# Patient Record
Sex: Male | Born: 1970 | Race: Black or African American | Hispanic: No | Marital: Married | Smoking: Never smoker
Health system: Southern US, Community
[De-identification: ages and names within clinical notes are randomized; demographics above are authoritative.]

## PROBLEM LIST (undated history)

## (undated) DIAGNOSIS — J45909 Unspecified asthma, uncomplicated: Secondary | ICD-10-CM

## (undated) DIAGNOSIS — I10 Essential (primary) hypertension: Secondary | ICD-10-CM

## (undated) DIAGNOSIS — E119 Type 2 diabetes mellitus without complications: Secondary | ICD-10-CM

## (undated) HISTORY — PX: OTHER SURGICAL HISTORY: SHX169

## (undated) HISTORY — PX: ABDOMINAL SURGERY: SHX537

## (undated) HISTORY — PX: SINUS SURGERY: SHX187

## (undated) HISTORY — PX: APPENDECTOMY (OPEN): SHX54

---

## 2005-07-25 ENCOUNTER — Ambulatory Visit (INDEPENDENT_AMBULATORY_CARE_PROVIDER_SITE_OTHER)
Admit: 2005-07-25 | Disposition: A | Discharge status: Home / Self Care | Payer: Self-pay | Source: Ambulatory Visit | Admitting: Emergency Medicine

## 2016-08-09 ENCOUNTER — Ambulatory Visit
Admission: RE | Admit: 2016-08-09 | Discharge: 2016-08-09 | Disposition: A | Discharge status: Home / Self Care | Payer: Commercial Managed Care - POS | Source: Ambulatory Visit | Attending: Critical Care Medicine | Admitting: Critical Care Medicine

## 2016-08-09 DIAGNOSIS — R0602 Shortness of breath: Secondary | ICD-10-CM | POA: Insufficient documentation

## 2016-08-09 MED ORDER — SODIUM CHLORIDE 0.9 % IV SOLN
0.5000 mg | Freq: Once | INTRAVENOUS | Status: AC
Start: 2016-08-09 — End: 2016-08-09
  Administered 2016-08-09: 0.5 mg via RESPIRATORY_TRACT
  Filled 2016-08-09: qty 0.5

## 2016-08-09 MED ORDER — SODIUM CHLORIDE 0.9 % IV SOLN
20.0000 mg | Freq: Once | INTRAVENOUS | Status: DC
Start: 2016-08-09 — End: 2016-08-10
  Filled 2016-08-09: qty 20

## 2016-08-09 MED ORDER — SODIUM CHLORIDE 0.9 % IV SOLN
0.0500 mg | Freq: Once | INTRAVENOUS | Status: AC
Start: 2016-08-09 — End: 2016-08-09
  Administered 2016-08-09: 0.05 mg via RESPIRATORY_TRACT
  Filled 2016-08-09: qty 0.05

## 2016-08-09 MED ORDER — SODIUM CHLORIDE 0.9 % IV SOLN
50.0000 mg | Freq: Once | INTRAVENOUS | Status: DC
Start: 2016-08-09 — End: 2016-08-10
  Filled 2016-08-09: qty 50

## 2016-08-09 MED ORDER — ALBUTEROL SULFATE (2.5 MG/3ML) 0.083% IN NEBU
2.5000 mg | INHALATION_SOLUTION | Freq: Once | RESPIRATORY_TRACT | Status: AC
Start: 2016-08-09 — End: 2016-08-09
  Administered 2016-08-09: 2.5 mg via RESPIRATORY_TRACT
  Filled 2016-08-09: qty 3

## 2016-08-09 MED ORDER — SODIUM CHLORIDE 0.9 % IV SOLN
5.0000 mg | Freq: Once | INTRAVENOUS | Status: AC
Start: 2016-08-09 — End: 2016-08-09
  Administered 2016-08-09: 5 mg via RESPIRATORY_TRACT
  Filled 2016-08-09: qty 5

## 2016-08-17 NOTE — Procedures (Signed)
Service Date: 08/09/2016     Patient Type: O     PHYSICIAN/PROVIDER: Darrold Junker MD     REFERRING PHYSICIAN:      PROCEDURE:  Methacholine challenge test.     DATA:  The patient underwent a methacholine challenge test per the usual protocol.   After only level 3 of methacholine administration, there was a dramatic  and abrupt 61% decline in the FEV1.  There was improvement in the loss of  FEV1 after albuterol administration, although his lung capacity did not  return to baseline.     IMPRESSION:  Dramatically positive methacholine challenge test.           D:  08/17/2016 06:48 AM by Dr. Darrold Junker, MD [id number])  T:  08/17/2016 11:18 AM by NTS      Lorin Glass: [id number]) (Doc ID: [id number])

## 2016-10-09 ENCOUNTER — Emergency Department (HOSPITAL_BASED_OUTPATIENT_CLINIC_OR_DEPARTMENT_OTHER)
Admit: 2016-10-09 | Discharge: 2016-10-09 | Disposition: A | Discharge status: Home / Self Care | Payer: Self-pay | Attending: Emergency Medicine | Admitting: Emergency Medicine

## 2016-10-09 ENCOUNTER — Emergency Department (HOSPITAL_BASED_OUTPATIENT_CLINIC_OR_DEPARTMENT_OTHER)
Admission: EM | Admit: 2016-10-09 | Discharge: 2016-10-09 | Disposition: A | Discharge status: Home / Self Care | Payer: Self-pay | Attending: Emergency Medicine | Admitting: Emergency Medicine

## 2016-10-09 ENCOUNTER — Encounter (HOSPITAL_BASED_OUTPATIENT_CLINIC_OR_DEPARTMENT_OTHER): Payer: Self-pay | Admitting: *Deleted

## 2016-10-09 DIAGNOSIS — J45901 Unspecified asthma with (acute) exacerbation: Secondary | ICD-10-CM | POA: Insufficient documentation

## 2016-10-09 HISTORY — DX: Unspecified asthma, uncomplicated: J45.909

## 2016-10-09 MED ORDER — PREDNISONE 20 MG PO TABS: 40.0000 mg | ORAL_TABLET | Freq: Every day | ORAL | 0 refills | Status: AC

## 2016-10-09 MED ORDER — DEXAMETHASONE SODIUM PHOSPHATE 10 MG/ML IJ SOLN
10.0000 mg | Freq: Once | INTRAMUSCULAR | Status: AC
  Administered 2016-10-09: 10 mg via INTRAMUSCULAR
  Filled 2016-10-09: qty 1

## 2016-10-09 MED ORDER — IPRATROPIUM-ALBUTEROL 0.5-2.5 (3) MG/3ML IN SOLN: 3.0000 mL | Freq: Four times a day (QID) | RESPIRATORY_TRACT | Status: DC

## 2016-10-09 MED ORDER — IPRATROPIUM-ALBUTEROL 0.5-2.5 (3) MG/3ML IN SOLN
3.0000 mL | Freq: Once | RESPIRATORY_TRACT | Status: AC
  Administered 2016-10-09: 3 mL via RESPIRATORY_TRACT
  Filled 2016-10-09: qty 3

## 2016-10-09 MED ORDER — ALBUTEROL (5 MG/ML) CONTINUOUS INHALATION SOLN
15.0000 mg/h | INHALATION_SOLUTION | Freq: Once | RESPIRATORY_TRACT | Status: AC
  Administered 2016-10-09: 15 mg/h via RESPIRATORY_TRACT
  Filled 2016-10-09: qty 20

## 2016-10-09 MED ORDER — ALBUTEROL SULFATE (2.5 MG/3ML) 0.083% IN NEBU
2.5000 mg | INHALATION_SOLUTION | Freq: Once | RESPIRATORY_TRACT | Status: AC
  Administered 2016-10-09: 2.5 mg via RESPIRATORY_TRACT
  Filled 2016-10-09: qty 3

## 2016-10-09 NOTE — ED Provider Notes (Signed)
[facility] Provider Note   CSN: [id number] Arrival date & time: 10/09/16  0054     History   Chief Complaint Chief Complaint  Patient presents with  . Shortness of Breath    HPI Kyle Wong is a 45 y.o. male.  HPI  This is a 45 year old male with history of asthma who presents with shortness of breath. Patient reports 2 day history of worsening upper respiratory symptoms including cough and shortness of breath. Denies fevers. He is currently traveling and does not have his Advair. Reports that he is using his rescue inhaler at home with minimal relief. No history of admission with asthma. No intubations. Patient denies chest pain.  Past Medical History:  Diagnosis Date  . Asthma     There are no active problems to display for this patient.   Past Surgical History:  Procedure Laterality Date  . ABDOMINAL SURGERY    . apendectomy         Home Medications    Prior to Admission medications   Medication Sig Start Date End Date Taking? Authorizing Provider  albuterol (PROVENTIL HFA;VENTOLIN HFA) 108 (90 Base) MCG/ACT inhaler Inhale 2 puffs into the lungs every 6 (six) hours as needed for wheezing or shortness of breath.   Yes Historical Provider, MD  fluticasone-salmeterol (ADVAIR HFA) 115-21 MCG/ACT inhaler Inhale 2 puffs into the lungs daily.   Yes Historical Provider, MD  montelukast (SINGULAIR) 10 MG tablet Take 10 mg by mouth at bedtime.   Yes Historical Provider, MD  predniSONE (DELTASONE) 20 MG tablet Take 2 tablets (40 mg total) by mouth daily. 10/09/16   Merryl Hacker, MD    Family History History reviewed. No pertinent family history.  Social History Social History  Substance Use Topics  . Smoking status: Never Smoker  . Smokeless tobacco: Never Used  . Alcohol use No     Allergies   Patient has no allergy information on record.   Review of Systems Review of Systems  Constitutional: Negative for chills and fever.    Respiratory: Positive for cough, shortness of breath and wheezing.   Cardiovascular: Negative for chest pain.  Gastrointestinal: Negative for abdominal pain.  All other systems reviewed and are negative.    Physical Exam Updated Vital Signs BP 145/85 (BP Location: Left Arm)   Pulse 114   Temp 98.1 F (36.7 C)   Resp 20   Ht 5' 10"  (1.778 m)   Wt (!) 350 lb (158.8 kg)   SpO2 90%   BMI 50.22 kg/m   Physical Exam  Constitutional: He is oriented to person, place, and time. He appears well-developed and well-nourished.  Morbidly obese  HENT:  Head: Normocephalic and atraumatic.  Cardiovascular: Normal rate, regular rhythm and normal heart sounds.   No murmur heard. Pulmonary/Chest: Effort normal. No respiratory distress. He has wheezes.  Tight, poor air movement, expiratory wheezing noted  Abdominal: Soft. There is no tenderness.  Musculoskeletal: He exhibits no edema.  Neurological: He is alert and oriented to person, place, and time.  Skin: Skin is warm and dry.  Psychiatric: He has a normal mood and affect.  Nursing note and vitals reviewed.    ED Treatments / Results  Labs (all labs ordered are listed, but only abnormal results are displayed) Labs Reviewed - No data to display  EKG  EKG Interpretation None       Radiology Dg Chest 2 View  Result Date: 10/09/2016 CLINICAL DATA:  Cough and shortness of breath EXAM:  CHEST  2 VIEW COMPARISON:  None. FINDINGS: The heart size and mediastinal contours are within normal limits. Both lungs are clear. The visualized skeletal structures are unremarkable. IMPRESSION: No active cardiopulmonary disease. Electronically Signed   By: Ulyses Jarred M.D.   On: 10/09/2016 02:22    Procedures Procedures (including critical care time)  Medications Ordered in ED Medications  ipratropium-albuterol (DUONEB) 0.5-2.5 (3) MG/3ML nebulizer solution 3 mL (3 mLs Nebulization Given 10/09/16 0113)  albuterol (PROVENTIL) (2.5 MG/3ML)  0.083% nebulizer solution 2.5 mg (2.5 mg Nebulization Given 10/09/16 0113)  albuterol (PROVENTIL,VENTOLIN) solution continuous neb (15 mg/hr Nebulization Given 10/09/16 0122)  dexamethasone (DECADRON) injection 10 mg (10 mg Intramuscular Given 10/09/16 0147)  ipratropium-albuterol (DUONEB) 0.5-2.5 (3) MG/3ML nebulizer solution 3 mL (3 mLs Nebulization Given 10/09/16 0301)     Initial Impression / Assessment and Plan / ED Course  I have reviewed the triage vital signs and the nursing notes.  Pertinent labs & imaging results that were available during my care of the patient were reviewed by me and considered in my medical decision making (see chart for details).  Clinical Course     Patient presents with cough and shortness of breath. Tight on exam. Wheezing. Initial O2 sats 90% on room air. Patient was started on continuous DuoNeb. Given Decadron. On multiple rechecks, he continues to wheeze with slightly better air movement. Pulse ox 90-92%. Discuss with patient my concerns that he is not opening up appropriately. Patient would like to give some time and travel more DuoNeb. Patient was administered or more DuoNeb and ambulated without difficulty. He was able to maintain his pulse ox 90-92% during ambulation. He does not wish to be admitted. Discuss with patient that he needs to use his albuterol every 2 hrs for the next 6-8 hours. He was given strict return precautions.  After history, exam, and medical workup I feel the patient has been appropriately medically screened and is safe for discharge home. Pertinent diagnoses were discussed with the patient. Patient was given return precautions.  Final Clinical Impressions(s) / ED Diagnoses   Final diagnoses:  Moderate asthma with exacerbation, unspecified whether persistent    New Prescriptions New Prescriptions   PREDNISONE (DELTASONE) 20 MG TABLET    Take 2 tablets (40 mg total) by mouth daily.     Merryl Hacker, MD 10/09/16 [phone]

## 2016-10-09 NOTE — ED Notes (Signed)
Patient transported to X-ray 

## 2016-10-09 NOTE — ED Notes (Signed)
Ambulated patient via pulse Ox. Patient maintained oxygen saturations of 90-92% with heart rate of 115-120. Patient maintained steady gait without difficulty or SOB. Patient returned to room. Patient tolerated well.

## 2016-10-09 NOTE — ED Triage Notes (Signed)
Pt with URI sx 2 days worsening cough and SHOB x 12 hours. Pt with hx of asthma traveling  and forgot his advair. Denies fever

## 2016-10-10 ENCOUNTER — Other Ambulatory Visit (HOSPITAL_BASED_OUTPATIENT_CLINIC_OR_DEPARTMENT_OTHER): Payer: Self-pay

## 2017-12-30 IMAGING — CR DG CHEST 2V
2 series · 2 of 2 positions shown · non-contrast
Comparison: None.

CLINICAL DATA: Cough and shortness of breath

EXAM:
CHEST  2 VIEW

[w chest pa]
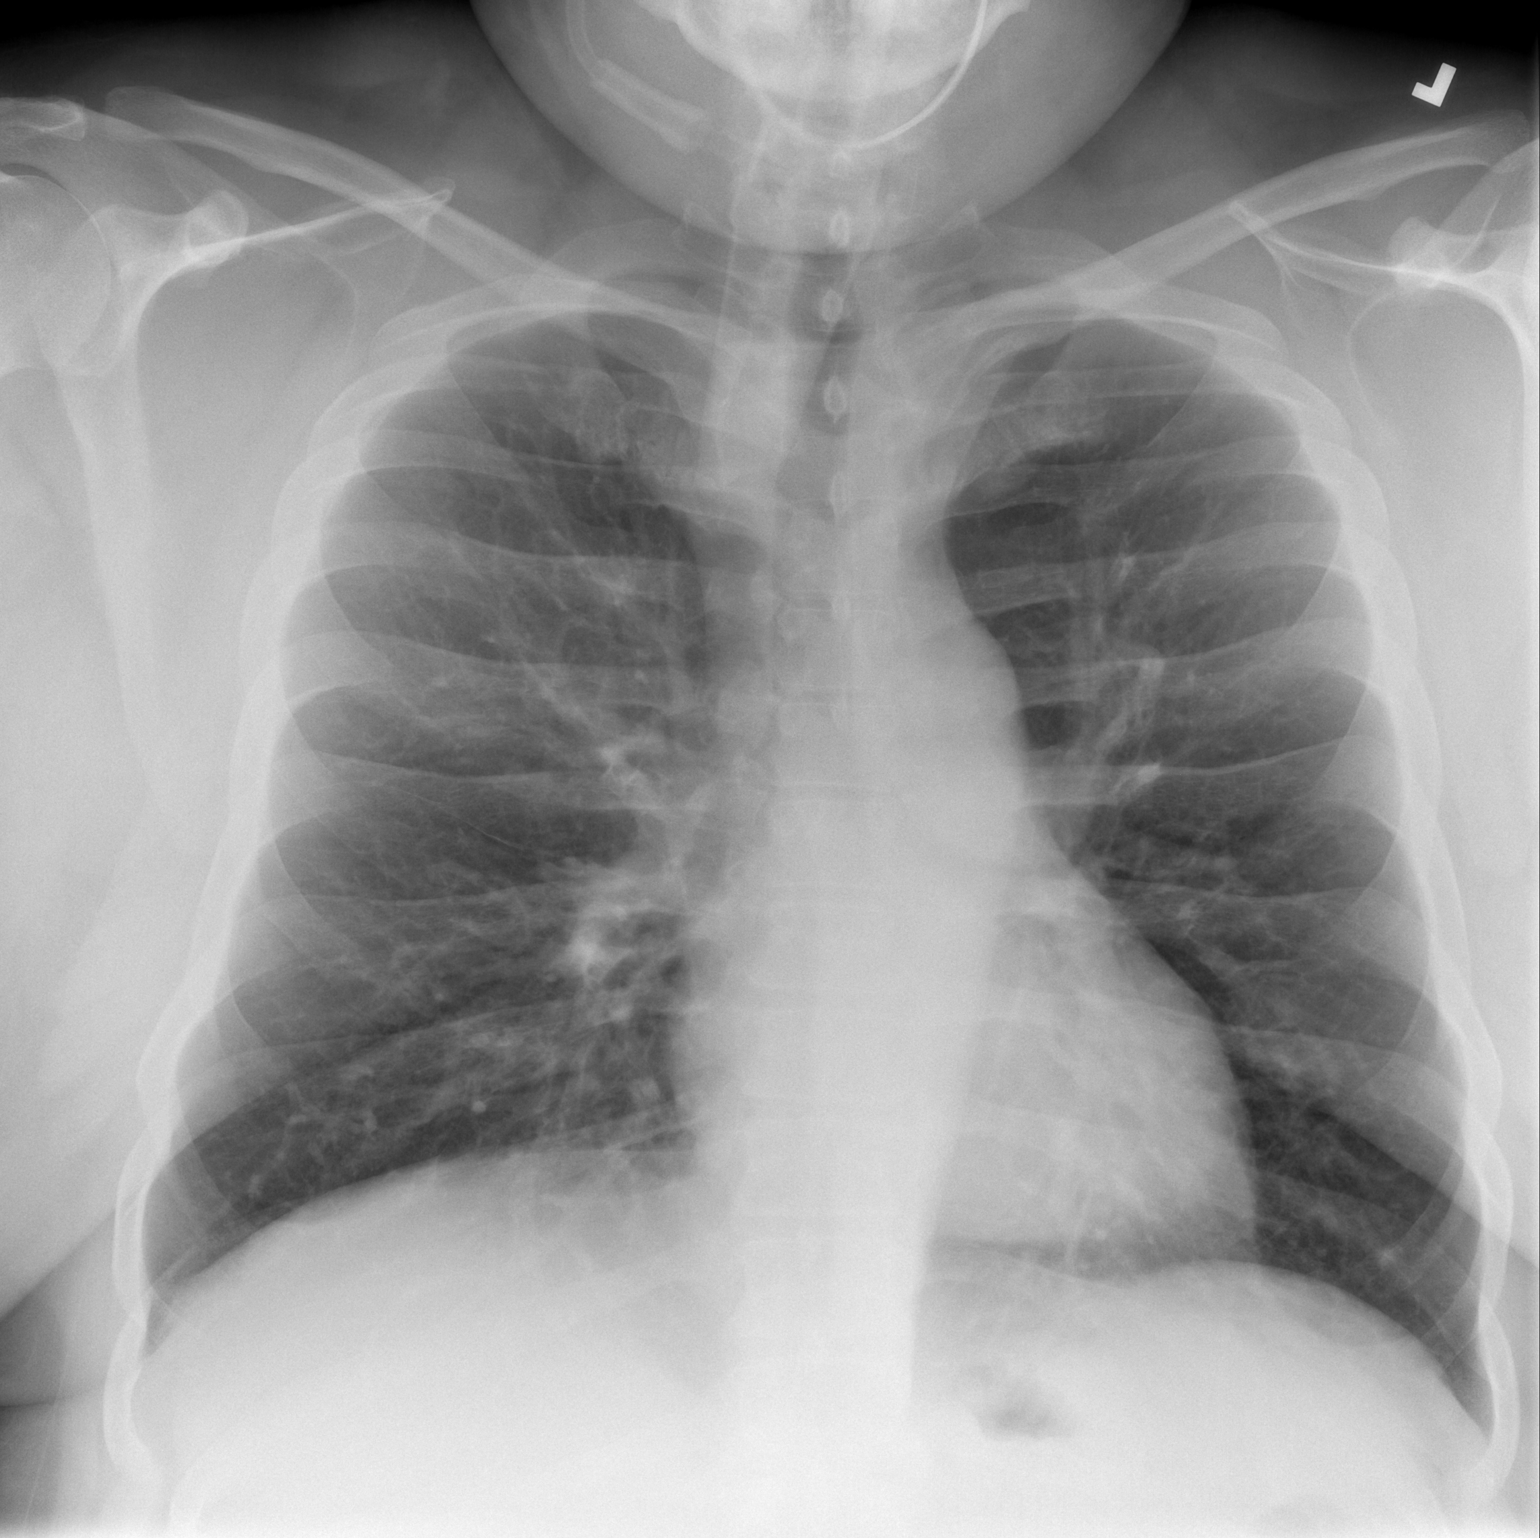

[w chest lat]
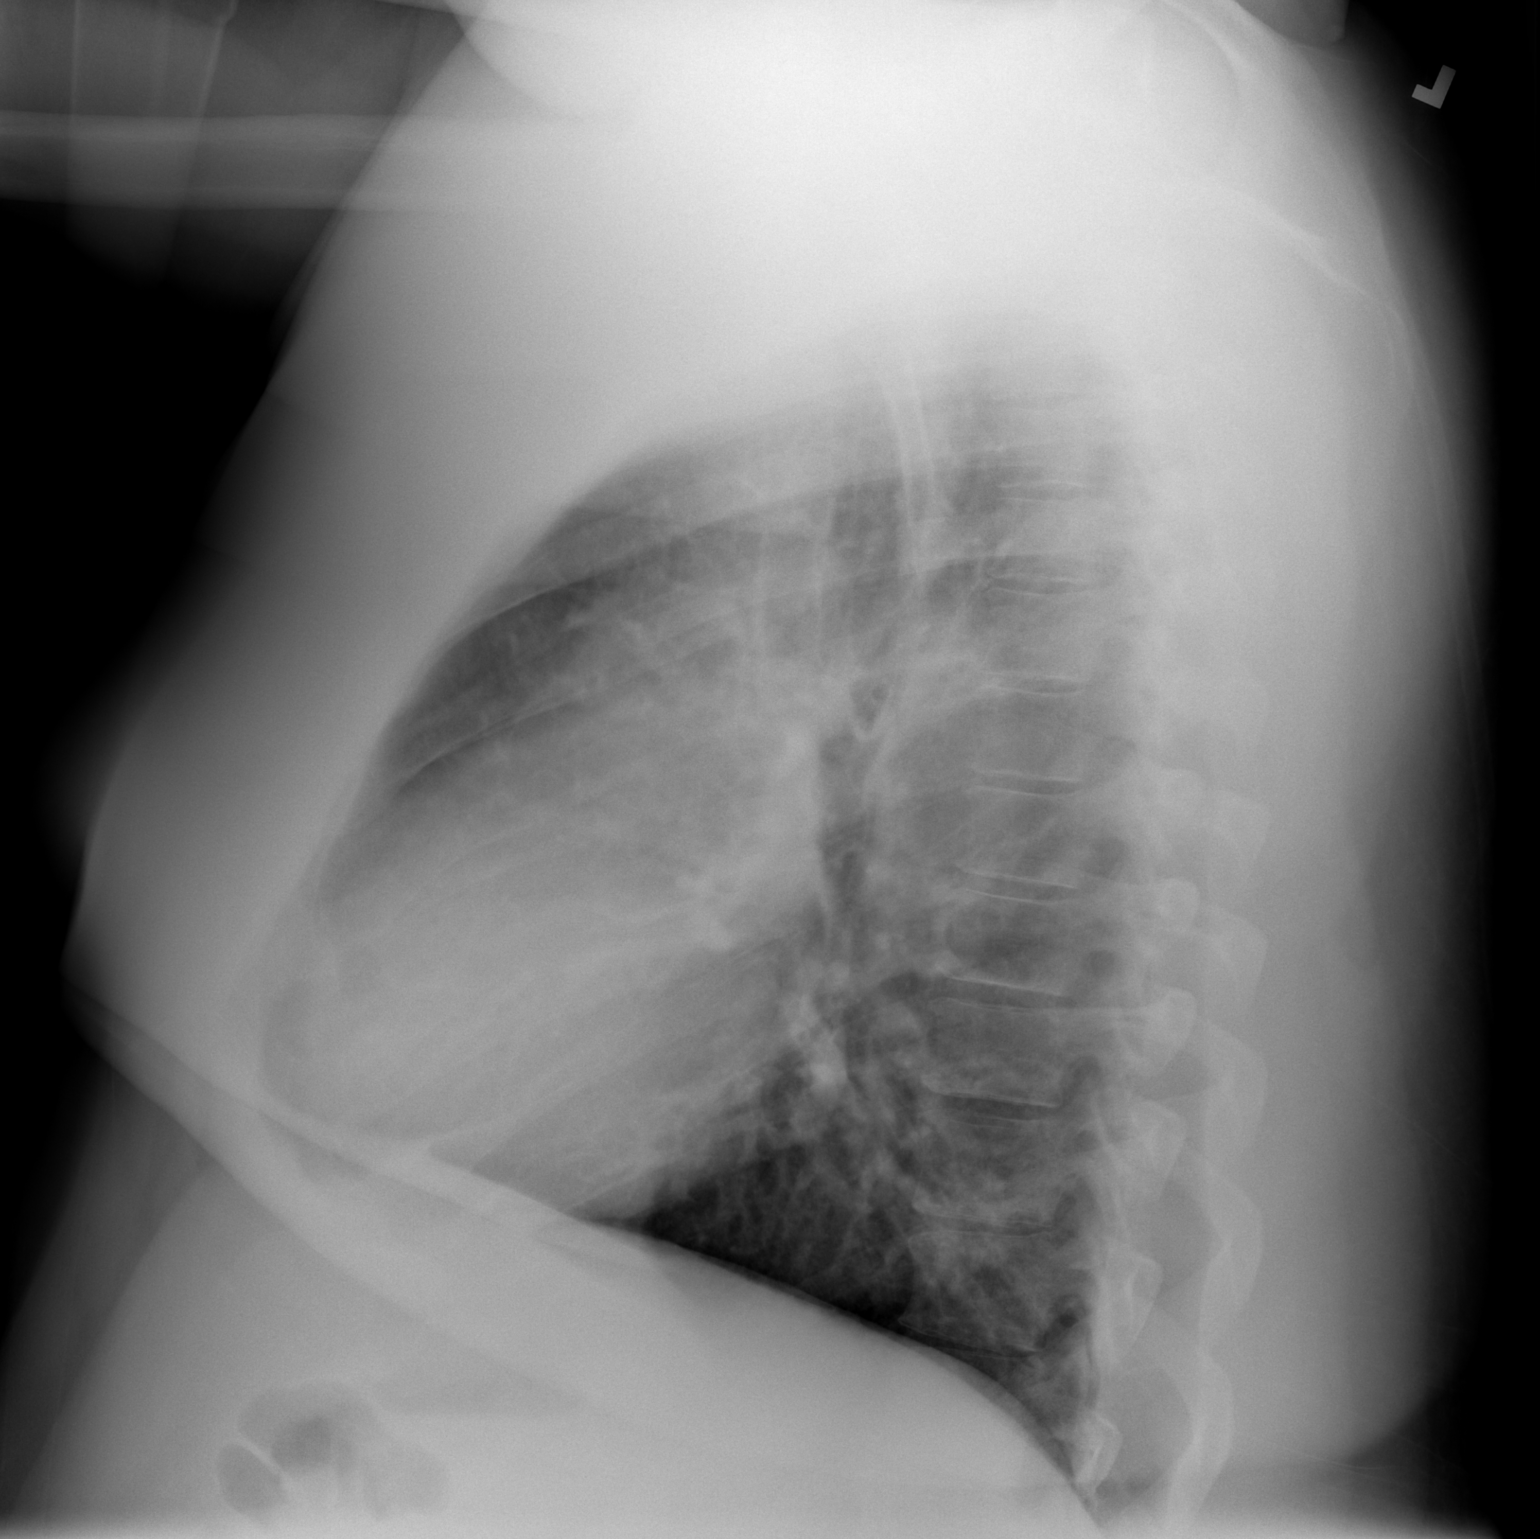

[2 of 2 positions shown; findings below may reference images not displayed]

FINDINGS: The heart size and mediastinal contours are within normal limits.
Both lungs are clear. The visualized skeletal structures are
unremarkable.
IMPRESSION: No active cardiopulmonary disease.

## 2018-03-14 ENCOUNTER — Encounter (INDEPENDENT_AMBULATORY_CARE_PROVIDER_SITE_OTHER): Payer: Self-pay | Admitting: Sleep Medicine

## 2020-02-03 ENCOUNTER — Emergency Department: Payer: PRIVATE HEALTH INSURANCE

## 2020-02-03 ENCOUNTER — Emergency Department
Admission: EM | Admit: 2020-02-03 | Discharge: 2020-02-03 | Disposition: A | Discharge status: Other Acute Inpatient Hospital | Payer: PRIVATE HEALTH INSURANCE | Attending: Emergency Medicine | Admitting: Emergency Medicine

## 2020-02-03 DIAGNOSIS — Z8249 Family history of ischemic heart disease and other diseases of the circulatory system: Secondary | ICD-10-CM | POA: Insufficient documentation

## 2020-02-03 DIAGNOSIS — E7800 Pure hypercholesterolemia, unspecified: Secondary | ICD-10-CM

## 2020-02-03 DIAGNOSIS — Z6841 Body Mass Index (BMI) 40.0 and over, adult: Secondary | ICD-10-CM | POA: Insufficient documentation

## 2020-02-03 DIAGNOSIS — Z7984 Long term (current) use of oral hypoglycemic drugs: Secondary | ICD-10-CM | POA: Insufficient documentation

## 2020-02-03 DIAGNOSIS — Z7951 Long term (current) use of inhaled steroids: Secondary | ICD-10-CM | POA: Insufficient documentation

## 2020-02-03 DIAGNOSIS — E119 Type 2 diabetes mellitus without complications: Secondary | ICD-10-CM | POA: Insufficient documentation

## 2020-02-03 DIAGNOSIS — I10 Essential (primary) hypertension: Secondary | ICD-10-CM | POA: Insufficient documentation

## 2020-02-03 DIAGNOSIS — J4520 Mild intermittent asthma, uncomplicated: Secondary | ICD-10-CM | POA: Insufficient documentation

## 2020-02-03 DIAGNOSIS — R072 Precordial pain: Secondary | ICD-10-CM | POA: Insufficient documentation

## 2020-02-03 DIAGNOSIS — Z8639 Personal history of other endocrine, nutritional and metabolic disease: Secondary | ICD-10-CM

## 2020-02-03 HISTORY — DX: Unspecified asthma, uncomplicated: J45.909

## 2020-02-03 HISTORY — DX: Type 2 diabetes mellitus without complications: E11.9

## 2020-02-03 HISTORY — DX: Essential (primary) hypertension: I10

## 2020-02-03 LAB — COMPREHENSIVE METABOLIC PANEL
ALT: 44 U/L (ref 0–55)
AST (SGOT): 29 U/L (ref 5–34)
Albumin/Globulin Ratio: 1.2 (ref 0.9–2.2)
Albumin: 4 g/dL (ref 3.5–5.0)
Alkaline Phosphatase: 55 U/L (ref 38–106)
Anion Gap: 12 (ref 5.0–15.0)
BUN: 11 mg/dL (ref 9.0–28.0)
Bilirubin, Total: 0.4 mg/dL (ref 0.2–1.2)
CO2: 24 mEq/L (ref 22–29)
Calcium: 9.2 mg/dL (ref 8.5–10.5)
Chloride: 106 mEq/L (ref 100–111)
Creatinine: 0.9 mg/dL (ref 0.7–1.3)
Globulin: 3.3 g/dL (ref 2.0–3.6)
Glucose: 137 mg/dL — ABNORMAL HIGH (ref 70–100)
Potassium: 4.2 mEq/L (ref 3.5–5.1)
Protein, Total: 7.3 g/dL (ref 6.0–8.3)
Sodium: 142 mEq/L (ref 136–145)

## 2020-02-03 LAB — ECG 12-LEAD
Atrial Rate: 86 {beats}/min
P Axis: 80 degrees
P-R Interval: 168 ms
Q-T Interval: 350 ms
QRS Duration: 100 ms
QTC Calculation (Bezet): 418 ms
R Axis: 5 degrees
T Axis: 24 degrees
Ventricular Rate: 86 {beats}/min

## 2020-02-03 LAB — CBC AND DIFFERENTIAL
Absolute NRBC: 0 10*3/uL (ref 0.00–0.00)
Basophils Absolute Automated: 0.05 10*3/uL (ref 0.00–0.08)
Basophils Automated: 0.8 %
Eosinophils Absolute Automated: 0.64 10*3/uL — ABNORMAL HIGH (ref 0.00–0.44)
Eosinophils Automated: 10.5 %
Hematocrit: 42.9 % (ref 37.6–49.6)
Hgb: 14.2 g/dL (ref 12.5–17.1)
Immature Granulocytes Absolute: 0.01 10*3/uL (ref 0.00–0.07)
Immature Granulocytes: 0.2 %
Lymphocytes Absolute Automated: 1.91 10*3/uL (ref 0.42–3.22)
Lymphocytes Automated: 31.3 %
MCH: 30.7 pg (ref 25.1–33.5)
MCHC: 33.1 g/dL (ref 31.5–35.8)
MCV: 92.9 fL (ref 78.0–96.0)
MPV: 10.4 fL (ref 8.9–12.5)
Monocytes Absolute Automated: 0.54 10*3/uL (ref 0.21–0.85)
Monocytes: 8.9 %
Neutrophils Absolute: 2.95 10*3/uL (ref 1.10–6.33)
Neutrophils: 48.3 %
Nucleated RBC: 0 /100 WBC (ref 0.0–0.0)
Platelets: 224 10*3/uL (ref 142–346)
RBC: 4.62 10*6/uL (ref 4.20–5.90)
RDW: 14 % (ref 11–15)
WBC: 6.1 10*3/uL (ref 3.10–9.50)

## 2020-02-03 LAB — IHS D-DIMER: D-Dimer: 0.28 ug/mL FEU (ref 0.00–0.60)

## 2020-02-03 LAB — B-TYPE NATRIURETIC PEPTIDE: B-Natriuretic Peptide: <10 pg/mL (ref 0–100)

## 2020-02-03 LAB — GFR: EGFR: >60

## 2020-02-03 LAB — TROPONIN I: Troponin I: <0.01 ng/mL (ref 0.00–0.05)

## 2020-02-03 MED ORDER — NITROGLYCERIN 0.4 MG SL SUBL
0.4000 mg | SUBLINGUAL_TABLET | SUBLINGUAL | Status: AC
Start: 2020-02-03 — End: 2020-02-03
  Administered 2020-02-03 (×3): 0.4 mg via SUBLINGUAL
  Filled 2020-02-03 (×2): qty 1

## 2020-02-03 MED ORDER — ASPIRIN 81 MG PO CHEW
324.0000 mg | CHEWABLE_TABLET | Freq: Once | ORAL | Status: AC
Start: 2020-02-03 — End: 2020-02-03
  Administered 2020-02-03: 324 mg via ORAL
  Filled 2020-02-03: qty 4

## 2020-02-03 MED ORDER — NITROGLYCERIN 2 % TD OINT
1.00 [in_us] | TOPICAL_OINTMENT | Freq: Once | TRANSDERMAL | Status: DC
Start: 2020-02-03 — End: 2020-02-03
  Administered 2020-02-03: 03:00:00 1 [in_us] via TOPICAL
  Filled 2020-02-03: qty 1

## 2020-02-03 MED ORDER — MORPHINE SULFATE 4 MG/ML IJ/IV SOLN (WRAP)
4.0000 mg | Freq: Once | Status: AC
Start: 2020-02-03 — End: 2020-02-03
  Administered 2020-02-03: 04:00:00 4 mg via INTRAVENOUS
  Filled 2020-02-03: qty 1

## 2020-02-03 MED ORDER — ALBUTEROL SULFATE (2.5 MG/3ML) 0.083% IN NEBU
2.50 mg | INHALATION_SOLUTION | Freq: Once | RESPIRATORY_TRACT | Status: AC
Start: 2020-02-03 — End: 2020-02-03
  Administered 2020-02-03: 03:00:00 2.5 mg via RESPIRATORY_TRACT
  Filled 2020-02-03: qty 3

## 2020-02-03 MED ORDER — ONDANSETRON HCL 4 MG/2ML IJ SOLN
4.00 mg | Freq: Once | INTRAMUSCULAR | Status: AC
Start: 2020-02-03 — End: 2020-02-03
  Administered 2020-02-03: 04:00:00 4 mg via INTRAVENOUS
  Filled 2020-02-03: qty 2

## 2020-02-03 NOTE — ED Triage Notes (Signed)
Patient reports left sided chest pain since Monday am.  States pain originally on/off but this am has been more constant.  Reports possibly slight SOB. Pain 4 out of 10. Denies any recent trauma

## 2020-02-03 NOTE — ED Provider Notes (Signed)
History     Chief Complaint   Patient presents with    Chest Pain     The history is provided by the patient and the spouse.   Chest Pain  Pain location:  L chest and substernal area  Pain quality: aching and dull    Radiates to: little bit to the right   Pain severity:  Moderate  Onset quality:  Gradual  Duration:  2 days  Timing:  Intermittent  Progression:  Worsening  Chronicity:  New  Context: not breathing, not at rest, not stress and not trauma    Relieved by:  Nothing  Worsened by:  Nothing  Ineffective treatments:  None tried  Associated symptoms: no abdominal pain, no anxiety, no back pain, no cough, no diaphoresis, no dizziness, no fever, no headache, no lower extremity edema, no nausea, no numbness, no shortness of breath, no vomiting and no weakness    Risk factors: diabetes mellitus, high cholesterol, hypertension, male sex and obesity    Risk factors: no prior DVT/PE and no smoking     49 year male with onset of chest pain yesterday am, pain left and substernal chest, off and on all day yesterday, lasting short time, maybe minutes. Sometimes it feels like it goes to the right as well. No sob, no vomiting, no sweating. No meds taken, nothing makes worse or better. No leg pain or edema. Denies fever, uri symptoms, vomiting or diarrhea.     PCP Ivar Bury   Past Medical History:   Diagnosis Date    Asthma     Hypertension    hx of dm, htn, cholesterol, obesity,. Asthma     History reviewed. No pertinent surgical history.    History reviewed. No pertinent family history.    Social  Social History     Tobacco Use    Smoking status: Never Smoker    Smokeless tobacco: Never Used   Substance Use Topics    Alcohol use: Yes     Comment: social    Drug use: Never       .     Allergies   Allergen Reactions    Motrin [Ibuprofen]      Makes asthma worse, tolerates asa        Home Medications     Med List Status: In Progress Set By: Earl Many, RN at 02/03/2020  2:40 AM                 fluticasone-salmeterol (ADVAIR DISKUS) 100-50 MCG/DOSE Aerosol Pwdr, Breath Activated     Inhale 1 puff into the lungs     lisinopril (ZESTRIL) 10 MG tablet     Take 10 mg by mouth daily     metFORMIN (GLUCOPHAGE-XR) 500 MG 24 hr tablet     Take 500 mg by mouth every morning with breakfast     rosuvastatin (CRESTOR) 10 MG tablet     Take 10 mg by mouth       crestor 10 mg daily     Review of Systems   Constitutional: Negative for diaphoresis and fever.   HENT: Negative for congestion, rhinorrhea and sore throat.    Respiratory: Negative for cough, shortness of breath and wheezing.    Cardiovascular: Positive for chest pain.   Gastrointestinal: Negative for abdominal pain, nausea and vomiting.   Musculoskeletal: Negative for back pain, gait problem and neck pain.   Neurological: Negative for dizziness, syncope, weakness, numbness and headaches.  Physical Exam    Heart Rate: 87, Temp: 97.6 F (36.4 C), Resp Rate: 17, SpO2: 98 %, Weight: (!) 185.1 kg    Physical Exam  Vitals and nursing note reviewed.   Constitutional:       General: He is not in acute distress.     Appearance: He is well-developed. He is not ill-appearing or diaphoretic.   HENT:      Head: Normocephalic and atraumatic.      Right Ear: External ear normal.      Left Ear: External ear normal.      Nose: Nose normal. No congestion or rhinorrhea.      Mouth/Throat:      Mouth: Mucous membranes are moist.      Pharynx: No oropharyngeal exudate or posterior oropharyngeal erythema.   Eyes:      Extraocular Movements: Extraocular movements intact.      Conjunctiva/sclera: Conjunctivae normal.      Pupils: Pupils are equal, round, and reactive to light.   Cardiovascular:      Rate and Rhythm: Normal rate and regular rhythm.      Heart sounds: No murmur.   Pulmonary:      Effort: Pulmonary effort is normal. No respiratory distress.      Breath sounds: No stridor. Examination of the right-lower field reveals wheezing. Examination of the left-lower field  reveals wheezing. Wheezing present. No rhonchi.      Comments: Very faint wheezing noted lower lobes both lungs   No dyspnea or increased work of breathing   Chest:      Chest wall: No tenderness.   Abdominal:      General: Abdomen is flat. Bowel sounds are normal. There is no distension.      Tenderness: There is no abdominal tenderness.   Musculoskeletal:         General: No swelling or tenderness. Normal range of motion.      Cervical back: Normal range of motion. No rigidity or tenderness.      Right lower leg: No tenderness. No edema.      Left lower leg: No tenderness. No edema.   Skin:     General: Skin is warm and dry.      Capillary Refill: Capillary refill takes less than 2 seconds.      Findings: No rash.   Neurological:      General: No focal deficit present.      Mental Status: He is alert and oriented to person, place, and time. Mental status is at baseline.      Cranial Nerves: Cranial nerves are intact. No cranial nerve deficit.      Sensory: Sensation is intact. No sensory deficit.      Motor: Motor function is intact. No weakness.      Coordination: Coordination is intact. Coordination normal.      Gait: Gait is intact. Gait normal.   Psychiatric:         Mood and Affect: Mood normal.         Behavior: Behavior normal.           MDM and ED Course     ED Medication Orders (From admission, onward)    Start Ordered     Status Ordering Provider    02/03/20 0346 02/03/20 0345  morphine injection 4 mg  Once     Route: Intravenous  Ordered Dose: 4 mg     Last MAR action: Given Delorise Jackson    02/03/20  2518 02/03/20 0345  ondansetron (ZOFRAN) injection 4 mg  Once     Route: Intravenous  Ordered Dose: 4 mg     Last MAR action: Given Delorise Jackson    02/03/20 0326 02/03/20 0325  nitroglycerin (NITRO-BID) 2 % ointment 1 inch  Once     Route: Topical  Ordered Dose: 1 inch     Last MAR action: Ointment Applied Marnee Spring J    02/03/20 0304 02/03/20 0303  albuterol (PROVENTIL) (2.5 MG/8ML) 0.083%  nebulizer solution 2.5 mg  RT - Once     Route: Nebulization  Ordered Dose: 2.5 mg     Last MAR action: Given Delorise Jackson    02/03/20 0254 02/03/20 0253  aspirin chewable tablet 324 mg  Once     Route: Oral  Ordered Dose: 324 mg     Last MAR action: Given Marnee Spring J    02/03/20 0254 02/03/20 0253  nitroglycerin (NITROSTAT) SL tablet 0.4 mg  Every 5 min     Route: Sublingual  Ordered Dose: 0.4 mg     Last MAR action: Given Marquist Binstock J             MDM  Number of Diagnoses or Management Options  Diagnosis management comments: Dr. Marnee Spring  is the primary attending for this patient and has obtained and performed the history, PE, and medical decision making for this patient.    Oxygen saturation by pulse oximetry is 95%-100%, Normal.  Interventions: None Needed and Patient Observed.    When I was within 6 feet of this patient I donned the following PPE:  Surgical Mask Yes, Gloves No  , Gown No  ; Goggles No  ; Face Shield No  , 8M 6000 Respirator No  ; N95 No  .  The patient was wearing a mask during my evaluation Yes.    EKG Interpretation  EKG interpreted by EDP  Compared to prior EKG dated none  Rate: Normal  Rhythm: sinus rhythm  Axis: Normal  ST Segments: Normal ST segments  Conduction: No blocks  Impression: Normal EKG  Delorise Jackson, MD   [phone]    DDx includes acs, mi, gastric and esophageal etiologies, pneumonia, low suspicion for pe, no chest wall pain, will check ekg, labs, chest x ray, asa and nitro     3:52 AM Spoke with Dr Marianna Payment at [facility] and will arrange for bed at obs unit.     Given the acute clinical presentation of chest pain , there is concern/risk for acs. Therefore pt warrants admission. Pt's condition can be life-threatening.    I spoke to hospitalist regarding admission. Dr Marianna Payment     Results     Procedure Component Value Units Date/Time    B-type Natriuretic Peptide ([id number]) Collected: 02/03/20 0303    Specimen: Blood Updated: 02/03/20 0339     B-Natriuretic Peptide <10 pg/mL      Troponin I ([id number]) Collected: 02/03/20 0237    Specimen: Blood Updated: 02/03/20 0339     Troponin I <0.01 ng/mL     Comprehensive metabolic panel ([id number])  (Abnormal) Collected: 02/03/20   0237    Specimen: Blood Updated: 02/03/20 0329     Glucose 137 mg/dL      BUN 11.0 mg/dL      Creatinine 0.9 mg/dL      Sodium 142 mEq/L      Potassium 4.2 mEq/L      Chloride 106 mEq/L  CO2 24 mEq/L      Calcium 9.2 mg/dL      Protein, Total 7.3 g/dL      Albumin 4.0 g/dL      AST (SGOT) 29 U/L      ALT 44 U/L      Alkaline Phosphatase 55 U/L      Bilirubin, Total 0.4 mg/dL      Globulin 3.3 g/dL      Albumin/Globulin Ratio 1.2     Anion Gap 12.0    GFR ([id number]) Collected: 02/03/20 0237     Updated: 02/03/20 0329     EGFR >60.0    D-Dimer ([id number]) Collected: 02/03/20 0303     Updated: 02/03/20 0327     D-Dimer 0.28 ug/mL FEU     CBC and differential ([id number])  (Abnormal) Collected: 02/03/20 0237    Specimen: Blood Updated: 02/03/20 0311     WBC 6.10 x10 3/uL      Hgb 14.2 g/dL      Hematocrit 42.9 %      Platelets 224 x10 3/uL      RBC 4.62 x10 6/uL      MCV 92.9 fL      MCH 30.7 pg      MCHC 33.1 g/dL      RDW 14 %      MPV 10.4 fL      Neutrophils 48.3 %      Lymphocytes Automated 31.3 %      Monocytes 8.9 %      Eosinophils Automated 10.5 %      Basophils Automated 0.8 %      Immature Granulocytes 0.2 %      Nucleated RBC 0.0 /100 WBC      Neutrophils Absolute 2.95 x10 3/uL      Lymphocytes Absolute Automated 1.91 x10 3/uL      Monocytes Absolute Automated 0.54 x10 3/uL      Eosinophils Absolute Automated 0.64 x10 3/uL      Basophils Absolute Automated 0.05 x10 3/uL      Immature Granulocytes Absolute 0.01 x10 3/uL      Absolute NRBC 0.00 x10 3/uL         Radiology Results (24 Hour)     Procedure Component Value Units Date/Time    Chest AP Portable ([id number]) Collected: 02/03/20 0401    Order Status: Completed Updated: 02/03/20 0410    Narrative:      HISTORY: Chest Pain    COMPARISON: No relevant prior  examination available for comparison.    FINDINGS:   LINES/TUBES: None.    LUNGS and PLEURA: No consolidation. No pleural effusion or pneumothorax.    HEART and MEDIASTINUM:  Within normal limits.           Impression:        No acute abnormality.        Adolph Pollack Merchant   02/03/2020 4:08 AM      Pain resolved on re check, will obs with [facility]                  Amount and/or Complexity of Data Reviewed  Clinical lab tests: ordered and reviewed  Tests in the radiology section of CPT: ordered and reviewed  Decide to obtain previous medical records or to obtain history from someone other than the patient: (03/08/2010 EST     EST: 7'25 Bruce to 10 Mets. stopped due to fatigue, target hr low  back pain, max HR 154 (84% PMHR).  Max B/p 220/70 at 5 min  recovery 150/70 .  1. No Chest pain  2. No arrhythmia  3. Nml HR response, HTN B/p Response  4. No ischemic ecg changes  5. Below avg exercise capacity    A:EST is negative for ischemia with below avg workload in this  49 y/o morbidly overweight AAM with IFG,Hyperlipidemia, Family  hx cad and atypical sx.HTN b/p response. Diagnoses recorded for  this visit were addressed and are stable unless otherwise  indicated in this note.)                 Heart Score      Value   History  1   EKG  0   Risk Factors  2   Total (with age)  4   Onset of pain (time of START of last episode of chest pain)?  >6 hrs ago          Procedures    Clinical Impression & Disposition     Clinical Impression  Final diagnoses:   Precordial pain   History of diabetes mellitus   Essential hypertension   Mild intermittent asthma without complication   Hypercholesterolemia        ED Disposition     ED Disposition Condition Date/Time Comment    Transfer to Another Facility  Tue Feb 03, 2020  3:56 AM [name] should be transferred out to pending [facility] placement 3:54 AM     Accepting Dr Freddie Breech at [place] 3:56 AM            New Prescriptions    No medications on file                 Shaton Lore,  Charlynne Cousins, MD  02/03/20 [phone]

## 2020-02-03 NOTE — ED Notes (Signed)
Patient accepted to [facility] CDU. Report to be called to [phone]. [facility] eta approx 90 min.

## 2020-07-13 ENCOUNTER — Encounter (INDEPENDENT_AMBULATORY_CARE_PROVIDER_SITE_OTHER): Payer: Self-pay

## 2020-07-13 ENCOUNTER — Emergency Department
Admission: EM | Admit: 2020-07-13 | Discharge: 2020-07-13 | Disposition: A | Discharge status: Home / Self Care | Payer: Self-pay | Attending: Emergency Medicine | Admitting: Emergency Medicine

## 2020-07-13 DIAGNOSIS — E119 Type 2 diabetes mellitus without complications: Secondary | ICD-10-CM | POA: Insufficient documentation

## 2020-07-13 DIAGNOSIS — I10 Essential (primary) hypertension: Secondary | ICD-10-CM | POA: Insufficient documentation

## 2020-07-13 DIAGNOSIS — J45901 Unspecified asthma with (acute) exacerbation: Secondary | ICD-10-CM | POA: Insufficient documentation

## 2020-07-13 MED ORDER — ALBUTEROL SULFATE (2.5 MG/3ML) 0.083% IN NEBU
2.50 mg | INHALATION_SOLUTION | Freq: Once | RESPIRATORY_TRACT | Status: AC
Start: 2020-07-13 — End: 2020-07-13
  Administered 2020-07-13: 16:00:00 2.5 mg via RESPIRATORY_TRACT
  Filled 2020-07-13: qty 3

## 2020-07-13 MED ORDER — NEBULIZER MISC
0 refills | Status: AC
Start: 2020-07-13 — End: ?

## 2020-07-13 MED ORDER — PREDNISONE 20 MG PO TABS
60.0000 mg | ORAL_TABLET | Freq: Once | ORAL | Status: AC
Start: 2020-07-13 — End: 2020-07-13
  Administered 2020-07-13: 16:00:00 60 mg via ORAL
  Filled 2020-07-13: qty 3

## 2020-07-13 MED ORDER — NEBULIZER MISC
0 refills | Status: DC
Start: 2020-07-13 — End: 2020-07-13

## 2020-07-13 MED ORDER — PREDNISONE 20 MG PO TABS
60.0000 mg | ORAL_TABLET | Freq: Every day | ORAL | 0 refills | Status: DC
Start: 2020-07-13 — End: 2020-12-27

## 2020-07-13 MED ORDER — ALBUTEROL SULFATE (2.5 MG/3ML) 0.083% IN NEBU
2.50 mg | INHALATION_SOLUTION | RESPIRATORY_TRACT | 0 refills | Status: DC | PRN
Start: 2020-07-13 — End: 2020-08-12

## 2020-07-13 MED ORDER — IPRATROPIUM BROMIDE 0.02 % IN SOLN
0.50 mg | Freq: Once | RESPIRATORY_TRACT | Status: AC
Start: 2020-07-13 — End: 2020-07-13
  Administered 2020-07-13: 16:00:00 0.5 mg via RESPIRATORY_TRACT
  Filled 2020-07-13: qty 2.5

## 2020-07-13 MED ORDER — ALBUTEROL SULFATE HFA 108 (90 BASE) MCG/ACT IN AERS
2.00 | INHALATION_SPRAY | Freq: Once | RESPIRATORY_TRACT | Status: AC
Start: 2020-07-13 — End: 2020-07-13
  Administered 2020-07-13: 17:00:00 2 via RESPIRATORY_TRACT
  Filled 2020-07-13: qty 8

## 2020-07-13 NOTE — Progress Notes (Signed)
[facility] for [employer] (previously [facility]):     Received a referral to schedule a follow up appointment with the [facility] for [employer].  Appointment scheduled for Mon 07/19/20 at 8:30AM with NP Maylon Peppers at [place] location.      Clinic address is as follows:    [address], [place]    Please notify patient to arrive 15 minutes early to the appointment and bring the following materials with them:    Insurance card (if insured) and photo ID  Medications in their original bottles  Glucometer/blood sugar log (if diabetic)  Weight log (if heart failure)  Proof of income (to enroll in medication assistance programs-first two pages of signed 1040 tax forms or last 2 months of pay stubs)      Catalina Gravel   Patient Access Associate  [facility] for [employer]  (Previously-[facility])  T: [phone]

## 2020-07-13 NOTE — Discharge Instructions (Signed)
Elevated Blood Pressure    During your visit today your blood pressure was higher than normal.    Check your blood pressure several times over the next several days, then follow up with your regular doctor. If you do not have a doctor, ask the medical staff to refer you to one.    You may need medication for your blood pressure if it stays high. Untreated high blood pressure can cause damage to your heart and kidneys and may lead to a heart attack or stroke. It is VERY IMPORTANT to follow up with your doctor.   Check your blood pressure daily and follow up with your doctor.   A doctor will diagnose high blood pressure only if your blood pressure is high for several days. Many pharmacies have machines that let you check your own blood pressure. You can also check with a fire station to see whether a paramedic will take your blood pressure. Another option is to purchase a blood pressure monitor to use at home. These are available at most pharmacies.     YOU SHOULD SEEK MEDICAL ATTENTION IMMEDIATELY, EITHER HERE OR AT THE NEAREST EMERGENCY DEPARTMENT, IF ANY OF THE FOLLOWING OCCURS:   You have a sudden or severe headache.   You are numb, tingly, or weak on one side of your body, half of your face droops, or you have trouble speaking.   You have chest pain.   You are short of breath.             Reactive Airway Disease    You have been seen for an episode of reactive airway disease (RAD).    RAD is sometimes called bronchospasm.    This condition happens when the breathing tubes are irritated and begin to spasm. The irritation causes the tubes to swell and make extra mucus. The spasms make it hard to breathe, especially when exhaling (breathing out).    Unlike asthma, chronic obstructive pulmonary disease (COPD) or chronic bronchitis, RAD is likely to come and go in response to something that irritates the airway. It is not usually a life-long disease. It may happen only one time or regularly every time  you are exposed to the thing that irritates your lungs.    Things that can cause your airways to spasm include:   Infections with a virus (a cold) or bacteria   Tobacco smoke or air pollution.    Exercise, especially in cold weather.   Dust mites.    Mold or mildew.   Pollen.   Perfume or pet dander.    Some symptoms are:    Wheezing.   Dry Coughing.   Trouble breathing.   Chest pain and tightness.    Medications can be used to help the bronchospasm. These medicines can include albuterol (Proventil) and steroids like prednisone.    The doctor prescribed prednisone (Prelone/Orapred). This is a steroid. It is to help stop the bronchospasm. Use it as directed to help reduce symptoms.    The doctor prescribed an albuterol (Ventolin/Proventil) inhaler. It is to help stop the bronchospasm. Use the albuterol (Ventolin/Proventil) inhaler every four hours as needed for wheezing, coughing or shortness of breath. Use this medicine as directed to help reduce symptoms. To use the inhaler correctly, use a spacer device. This helps deliver the medicine to your lungs.    Metered dose inhaler instructions:   Shake the inhaler.   Take caps off the inhaler and spacer.   Place the inhaler in the  spacer.   Breathe out.   Put the spacers mouthpiece in your mouth.   Press down on the inhaler. This delivers a puff into the spacer.   Slowly and deeply inhale through your mouth.   Hold your breath for 5 seconds. Then breathe out.   Wait 1 minute. Repeat steps above for each puff.   Put the caps back on your inhaler and spacer.   Clean the spacer using the instructions that came with it.   If you use steroid inhaler, rinse your mouth after use with water.    If you do not smoke, avoid others who do. Smoke will irritate your lungs and make your symptoms worse.    YOU SHOULD SEEK MEDICAL ATTENTION IMMEDIATELY, EITHER HERE OR AT THE NEAREST EMERGENCY DEPARTMENT, IF ANY OF THE FOLLOWING OCCURS:   Your  condition is not getting better in 48 to 72 hours or your symptoms get worse.   You are vomiting or can't keep medicines down.    You have dizziness, weakness or confusion.   Your shortness of breath gets worse.   Your inhaler does not help your breathing.   You feel chest pain.   You cough up green or yellow material.   You have a fever (temperature higher than 100.35F / 38C)   You are wheezing or have trouble breathing.    If you can't follow up with your doctor, or if at any time you feel you need to be rechecked or seen again, come back here or go to the nearest emergency department.           Rest, fluids.  Tylenol for pain, fever.   Use inhaler with spacer as needed for breathing issues.  May substitute nebulizer treatments as needed.  Continue other meds as prescribed.  Activity as tolerated.

## 2020-07-13 NOTE — Progress Notes (Signed)
CM consulted by PA [facility] as patient has asthma and will need to go home on a nebulizer machine. CM spoke with patient and he does not have insurance. CM informed patient about goodrx and he stated he uses it already. CM informed patient that goodrx does not work for the nebulizer machine but patient stated he can afford to purchase the machine at the pharmacy. CM confirmed patient does not have a PCP, he was in agreement with referral to the [facility]. CM sent referral. PA Kent notified. No other needs identified at this time. CM will continue to follow as needed.

## 2020-07-13 NOTE — ED Triage Notes (Signed)
Pt c/o SOB, on and off for past week, states he has history of asthma, this feels the same, has albuterol inhaler but it is running low and has not been helping as much. Pt also reports dry cough when he has an asthma attack. Denies N/V/D/chills.

## 2020-07-16 NOTE — ED Provider Notes (Signed)
History     Chief Complaint   Patient presents with    Shortness of Breath       Chief Complaint: Dyspnea  Location: Respiratory tract  Onset: 2 days prior to arrival  Character: Shortness of breath  Aggravating/Alleviating Factors: Aggravated by underlying asthma, weather changes, obesity.  Associated wheezing, chest tightness, dyspnea.  Symptom treatment with expired albuterol inhaler.  No alleviating factors.  Timing: Constant  Environment: Home  Severity: Mild to moderate  Context: Patient, alone to ED, for evaluation of dyspnea.  He states over the past 2 days he has been having increasing dyspnea that he attributes to weather changes and underlying asthma.  Dry, nonproductive cough with deep breathing.  He has been treating himself with at home albuterol metered-dose inhaler that he believes is expired.    Patient reports completion of [place] COVID-19 vaccination.    Denies fevers chills or sweats nasal congestion postnasal drip chest pain or palpitations abdominal pain nausea vomiting diarrhea lightheadedness dizziness weakness travel trauma or known ill contacts.    PMD  none    The history is provided by the patient and medical records. No language interpreter was used.        Past Medical History:   Diagnosis Date    Asthma     Diabetes mellitus     Hypertension        History reviewed. No pertinent surgical history.    History reviewed. No pertinent family history.    Social  Social History     Tobacco Use    Smoking status: Never Smoker    Smokeless tobacco: Never Used   Brewing technologist Use: Never used   Substance Use Topics    Alcohol use: Yes     Comment: social    Drug use: Never       .     Allergies   Allergen Reactions    Motrin [Ibuprofen]      Makes asthma worse, tolerates asa        Home Medications     Med List Status: Complete Set By: Nolon Nations, RN at 07/13/2020  3:20 PM                albuterol sulfate HFA (PROVENTIL) 108 (90 Base) MCG/ACT inhaler     TAKE 2 PUFFS BY  MOUTH 4 TIMES A DAY     lisinopril (ZESTRIL) 10 MG tablet     Take 10 mg by mouth daily     rosuvastatin (CRESTOR) 10 MG tablet     TAKE 1 TABLET BY MOUTH EVERY DAY TO PREVENT HEART ATTACK AND STROKE           Review of Systems   Constitutional: Negative for activity change, chills, diaphoresis and fever.   HENT: Negative for congestion, ear discharge, ear pain, facial swelling, postnasal drip, rhinorrhea, sinus pressure, sinus pain, sore throat and trouble swallowing.    Eyes: Negative for pain, discharge and redness.   Respiratory: Positive for cough and shortness of breath. Negative for chest tightness and wheezing.    Cardiovascular: Negative for chest pain, palpitations and leg swelling.   Gastrointestinal: Negative for abdominal pain, diarrhea, nausea and vomiting.   Endocrine: Negative for polydipsia, polyphagia and polyuria.   Musculoskeletal: Negative for arthralgias, back pain, myalgias and neck pain.   Skin: Negative for color change, rash and wound.   Allergic/Immunologic: Negative for environmental allergies, food allergies and immunocompromised state.   Neurological: Negative for  dizziness, tremors, syncope, weakness, light-headedness, numbness and headaches.   Hematological: Negative for adenopathy. Does not bruise/bleed easily.   Psychiatric/Behavioral: Negative for agitation and confusion. The patient is not nervous/anxious.    All other systems reviewed and are negative.      Physical Exam    BP: (!) 165/99, Heart Rate: 90, Temp: 97.1 F (36.2 C), Resp Rate: 20, SpO2: 95 %, Weight: (!) 174.6 kg    Physical Exam  Vitals and nursing note reviewed.   Constitutional:       General: He is not in acute distress.     Appearance: He is well-developed. He is obese. He is not ill-appearing, toxic-appearing or diaphoretic.   HENT:      Head: Normocephalic and atraumatic.      Right Ear: Tympanic membrane, ear canal and external ear normal.      Left Ear: Tympanic membrane, ear canal and external ear normal.       Nose: Nose normal. No mucosal edema, congestion or rhinorrhea.      Right Sinus: No maxillary sinus tenderness or frontal sinus tenderness.      Left Sinus: No maxillary sinus tenderness or frontal sinus tenderness.      Mouth/Throat:      Mouth: Mucous membranes are moist.      Pharynx: No oropharyngeal exudate or posterior oropharyngeal erythema.      Tonsils: No tonsillar abscesses.   Eyes:      General: Lids are normal. No scleral icterus.     Conjunctiva/sclera: Conjunctivae normal.      Right eye: Right conjunctiva is not injected.      Left eye: Left conjunctiva is not injected.      Pupils: Pupils are equal, round, and reactive to light.   Neck:      Thyroid: No thyromegaly.   Cardiovascular:      Rate and Rhythm: Normal rate and regular rhythm.      Pulses: Normal pulses.           Radial pulses are 2+ on the right side.      Heart sounds: Normal heart sounds. No murmur heard.     Pulmonary:      Effort: Pulmonary effort is normal. No tachypnea, bradypnea, accessory muscle usage or respiratory distress.      Breath sounds: Decreased air movement present. No transmitted upper airway sounds. Examination of the right-upper field reveals wheezing. Examination of the left-upper field reveals wheezing. Examination of the right-middle field reveals wheezing. Examination of the left-middle field reveals wheezing. Examination of the right-lower field reveals wheezing. Examination of the left-lower field reveals wheezing. Wheezing present. No decreased breath sounds, rhonchi or rales.   Abdominal:      General: Abdomen is protuberant. Bowel sounds are normal. There is no distension or abdominal bruit.      Palpations: Abdomen is soft. Abdomen is not rigid. There is no mass or pulsatile mass.      Tenderness: There is no abdominal tenderness. There is no guarding or rebound.      Hernia: No hernia is present.   Musculoskeletal:         General: No swelling, tenderness, deformity or signs of injury. Normal range  of motion.      Cervical back: Normal range of motion and neck supple.   Lymphadenopathy:      Cervical: No cervical adenopathy.   Skin:     General: Skin is warm and dry.      Capillary Refill:  Capillary refill takes less than 2 seconds.      Findings: No abrasion, ecchymosis, erythema, laceration, lesion, petechiae or rash.      Nails: There is no clubbing.   Neurological:      General: No focal deficit present.      Mental Status: He is alert and oriented to person, place, and time.      Sensory: No sensory deficit.      Motor: No abnormal muscle tone.      Coordination: Coordination normal.      Gait: Gait normal.   Psychiatric:         Mood and Affect: Mood normal.         Speech: Speech normal.         Behavior: Behavior normal. Behavior is cooperative.         Thought Content: Thought content normal.         Judgment: Judgment normal.           MDM and ED Course     ED Medication Orders (From admission, onward)    Start Ordered     Status Ordering Provider    07/13/20 1630 07/13/20 1630  albuterol sulfate HFA (PROVENTIL) inhaler 2 puff  RT - Once     Route: Inhalation  Ordered Dose: 2 puff     Last MAR action: Given Tommi Crepeau A    07/13/20 1535 07/13/20 1534  predniSONE (DELTASONE) tablet 60 mg  Once     Route: Oral  Ordered Dose: 60 mg     Last MAR action: Given Jadis Mika A    07/13/20 1535 07/13/20 1534  albuterol (PROVENTIL) (2.5 MG/22ML) 0.083% nebulizer solution 2.5 mg  RT - Once     Route: Nebulization  Ordered Dose: 2.5 mg     Last MAR action: Given Jamol Ginyard A    07/13/20 1535 07/13/20 1534  ipratropium (ATROVENT) 0.02 % nebulizer solution 0.5 mg  RT - Once     Route: Nebulization  Ordered Dose: 0.5 mg     Last MAR action: Given Silas Sedam A             MDM  Number of Diagnoses or Management Options  Elevated blood pressure reading with diagnosis of hypertension: established and worsening  Exacerbation of asthma, unspecified asthma severity, unspecified whether persistent: new and  requires workup  Diagnosis management comments: When I was within 6 feet of this patient I donned the following PPE:  Surgical Mask Yes, Gloves Yes, Gown No  ; Goggles No  ; Face Shield Yes, 22M 6000 Respirator Yes; N95 No  .  The patient was wearing a mask during my evaluation Yes.    Plan:  Neb, po steroids, supportive care, case management consult.    The attending signature signifies review and agreement of the history , PE, evaluation, clinical impression and discharge plan except as otherwise noted.    I,Smayan Hackbart, PA-C, have been the primary provider for [name] during this Emergency Dept visit.    Oxygen saturation by pulse oximetry is 95%-100%, Normal.  Interventions: None Needed.    DDX to include, but not limited to:  Asthma exacerbation, bronchospasm, pna, covid 19, influenza, other viral illness, HTN less likely ACS/MI, PE  Plan:  rx prednisone, nebulizer, albuterol nebules/MDI, supportive care,  Clinic referral.          " *This note was generated by the Epic EMR system/ Dragon speech recognition and   may  contain inherent errors or omissions not intended by the user. Grammatical    errors, random word insertions, deletions, pronoun errors and incomplete   sentences are occasional consequences of this technology due to software   limitations. Not all errors are caught or corrected. If there are questions or    concerns about the content of this note or information contained within the body   of this dictation they should be addressed directly with the author for   Clarification."       Amount and/or Complexity of Data Reviewed  Review and summarize past medical records: yes (Patient's old chart reviewed from ED and revealed history of asthma hypertension diabetes mellitus.  )  Discuss the patient with other providers: yes (ED case d/w attending, virmani, p, who examined the patient and agrees with the current course, tx, and disposition plan.)    Risk of Complications, Morbidity,  and/or Mortality  Presenting problems: high  Diagnostic procedures: high  Management options: high    Patient Progress  Patient progress: stable        ED Course as of Jul 16 1020   Tue Jul 13, 2020   1530 Case reviewed with Annie Main, case management.  He was able to coordinate follow-up appointment with the transitional care clinic.  Also able to coordinate getting the patient home nebulizer.  He is spoken with the patient.    [KS]   7322 Patient recheck.  Subjectively he feels more improved after neb treatment.  Lungs are clear with end expiratory wheezing.  No rhonchi.  Patient feels stable and ready for discharge.  His questions have been answered.  He voiced understanding of follow-up instructions.  Strict return precautions advised.    [KS]      ED Course User Index  [KS] Kynleigh Artz, Elita Boone, PA             Procedures    Clinical Impression & Disposition     Clinical Impression  Final diagnoses:   Exacerbation of asthma, unspecified asthma severity, unspecified whether persistent   Elevated blood pressure reading with diagnosis of hypertension        ED Disposition     ED Disposition Condition Date/Time Comment    Discharge  Tue Jul 13, 2020  4:53 PM [name] discharge to home/self care.    Condition at disposition: Stable           Discharge Medication List as of 07/13/2020  4:56 PM      START taking these medications    Details   albuterol (PROVENTIL) (2.5 MG/3ML) 0.083% nebulizer solution Take 3 mLs (2.5 mg total) by nebulization every 4 (four) hours as needed for Wheezing or Shortness of Breath (coughing), Starting Tue 07/13/2020, Until Thu 08/12/2020 at 2359, E-Rx      Nebulizer Misc Dispense one nebulizer machine to include mask and tubing    Sig:  Use for breathing trouble, wheezing Q 4-6 hrs., E-Rx      predniSONE (DELTASONE) 20 MG tablet Take 3 tablets (60 mg total) by mouth daily, Starting Tue 07/13/2020, E-Rx                       Angel Weedon, [employer], PA  07/16/20 1021       Cleotis Nipper,  MD  07/17/20 1231

## 2020-07-19 ENCOUNTER — Ambulatory Visit (INDEPENDENT_AMBULATORY_CARE_PROVIDER_SITE_OTHER): Payer: Self-pay | Admitting: Nurse Practitioner

## 2020-08-10 ENCOUNTER — Telehealth (INDEPENDENT_AMBULATORY_CARE_PROVIDER_SITE_OTHER): Payer: Self-pay

## 2020-08-10 NOTE — Telephone Encounter (Signed)
Patient calling to request refill for albuterol nebulizer.  Reports he has been taking 1-2 times daily.  Has 4 doses remaining.      Reports wheezing and shortness of breath.  Worsens with weather changes.  Resolves with nebulizer and albuterol inhaler which he is taking 1x daily every couple of days.    Reports he just started new job and insurance will start mid-November.      Advised to schedule appointment with Eulis Canner and forwarded refill request to NP [facility]    Nichola Sizer RN, BSN  Case Glass blower/designer for [employer]  (Previously Dealer)  [email or web address]  T [phone]  F [phone]

## 2020-08-12 ENCOUNTER — Ambulatory Visit (INDEPENDENT_AMBULATORY_CARE_PROVIDER_SITE_OTHER): Payer: Self-pay | Admitting: Nurse Practitioner

## 2020-10-13 ENCOUNTER — Encounter (INDEPENDENT_AMBULATORY_CARE_PROVIDER_SITE_OTHER): Payer: Self-pay | Admitting: Family

## 2020-10-13 ENCOUNTER — Ambulatory Visit (INDEPENDENT_AMBULATORY_CARE_PROVIDER_SITE_OTHER): Payer: 59 | Admitting: Family

## 2020-10-13 ENCOUNTER — Ambulatory Visit (INDEPENDENT_AMBULATORY_CARE_PROVIDER_SITE_OTHER): Payer: 59

## 2020-10-13 VITALS — BP 138/86 | HR 74 | Temp 96.9°F | Resp 12 | Ht 70.0 in | Wt >= 6400 oz

## 2020-10-13 DIAGNOSIS — M25561 Pain in right knee: Secondary | ICD-10-CM

## 2020-10-13 NOTE — Patient Instructions (Addendum)
XR Knee Right 4+ Views    Result Date: 10/13/2020  1. No acute osseous abnormality. 2. Early degenerative changes in the medial compartment. [name], MD  10/13/2020 1:20 PM      - Tylenol as needed  - Discussed symptoms to warrant immediate medical attention including worsening pain, swelling, inability to move affected site, numbness/tingling, go to the ER  - Gentle Range of Motion exercises  - Follow up with orthopedic if symptoms persist   - See below          Knee Pain  Knee pain is very common. Its especially common in active people who put a lot of pressure on their knees, like runners. It affects women more often than men.  Your kneecap (patella) is a thick, round bone. It covers and protects the front portion of your knee joint. It moves along a groove in your thighbone (femur) as part of the patellofemoral joint. A layer of cartilage surrounds the underside of your kneecap. This layer protects it from grinding against your femur.  When this cartilage softens and breaks down, it can cause knee pain. This is partly because of repetitive stress. The stress irritates the lining of the joint. This causes pain in the underlying bone.  What causes knee pain?  Many things can cause knee pain. You may have more than one cause. Some of these include:   Overuse of the knee joint   The kneecap doesnt line up with the tissue around it   Damage to small nerves in the area   Damage to the ligament-like structure that holds the kneecap in place (retinaculum)   Breakdown of the bone under the cartilage   Swelling in the soft tissues around the kneecap   Injury  You might be more likely to have knee pain if you:   Exercise a lot   Recently increased the intensity of your workouts   Have a body mass index (BMI) greater than 25   Have poor alignment of your kneecap   Walk with your feet turned overly outward or inward   Have weakness in surrounding muscle groups (inner quad or hip adductor muscles)   Have too  much tightness in surrounding muscle groups (hamstrings or iliotibial band)   Have a recent history of injury to the area   Are male  Symptoms of knee pain  This type of knee pain is a dull, aching pain in the front of the knee in the area under and around the kneecap. This pain may start quickly or slowly. Your pain might be worse when you squat, run, or sit for a long time. Climbing stairs may be painful or hard to do. You might also sometimes feel like your knee is giving out. You may have symptoms in one or both of your knees.  Diagnosing knee pain  Your healthcare provider will ask about your medical history and your symptoms. Be sure to describe any activities that make your knee pain worse. He or she will look at your knee. This will include tests of your range of motion, strength, and areas of pain of your knee. Your knee alignment will be checked.  Your healthcare provider will need to rule out other causes of your knee pain, such as arthritis. You may need an imaging test, such as an X-ray or MRI.  Treatment for knee pain  Treatments that can help ease your symptoms may include:   Avoiding activities for a while that make your  pain worse, returning to activity over time   Icing the outside of your knee when it causes you pain   Taking over-the-counter pain medicine such as NSAIDs   Wearing a knee brace or taping your knee to support it   Compression to help prevent swelling   Wearing special shoe inserts to help keep your feet in the proper alignment   Elevating your knee   Doing special exercises to stretch and strengthen the muscles around your hip and your knee  These steps help most people manage knee pain. But some cases of knee pain need to be treated with surgery. You rarely need surgery right away. You may need it later if other treatments dont work. Your healthcare provider may refer you to an orthopedic surgeon. He or she will talk with you about your choices.  Preventing knee  pain  Losing weight and correcting excess muscle tightness or muscle weakness may help lower your risk.  In some cases, you can prevent knee pain. To help prevent a flare-up of knee pain, do these things:   Regularly do all the exercises your doctor or physical therapist advises   Warm up fully before exercising   Support your knee as advised by your doctor or physical therapist   Increase training gradually, and ease up on training when needed   Have an expert check your gait for running or other sporting activities   Stretch properly before and after exercise   Replace your running shoes regularly   Lose excess weight  When to call your healthcare provider  Call your healthcare provider right away if:   Your symptoms dont get better after a few weeks of treatment   You have any new symptoms  [employer] last reviewed this educational content on 03/16/2018     2000-2021 [employer]. All rights reserved. This information is not intended as a substitute for professional medical care. Always follow your healthcare professional's instructions.

## 2020-10-13 NOTE — Progress Notes (Signed)
[facility]  PROGRESS NOTE     Patient: Andrew Casey   Date: 10/13/2020   MRN: [id number]       Andrew Casey is a 49 y.o. male      HISTORY     History obtained from: Patient    Chief Complaint   Patient presents with    Knee Pain     Right knee pain, onset 1 month ago.  Does not recall any injury.  Taking tylenol, last dose yesterday morning.         49 y.o. male patient presents with right knee pain that started 1 month ago. Denies any known injury. Patient is taking Tylenol.            Review of Systems   Constitutional: Negative for activity change, chills and fever.   Respiratory: Negative for cough.    Cardiovascular: Negative for chest pain and leg swelling.   Gastrointestinal: Negative for nausea and vomiting.   Musculoskeletal: Positive for arthralgias and gait problem. Negative for back pain, joint swelling and neck pain.   Skin: Negative for color change and wound.   Neurological: Negative for numbness and headaches.   Hematological: Does not bruise/bleed easily.   Psychiatric/Behavioral: Negative for confusion and sleep disturbance. The patient is not nervous/anxious.        History:    Pertinent Past Medical, Surgical, Family and Social History were reviewed.        Current Outpatient Medications:     lisinopril (ZESTRIL) 10 MG tablet, Take 10 mg by mouth daily, Disp: , Rfl:     metFORMIN (GLUCOPHAGE) 500 MG tablet, Take 500 mg by mouth 2 (two) times daily with meals, Disp: , Rfl:     rosuvastatin (CRESTOR) 10 MG tablet, TAKE 1 TABLET BY MOUTH EVERY DAY TO PREVENT HEART ATTACK AND STROKE, Disp: , Rfl:     albuterol sulfate HFA (PROVENTIL) 108 (90 Base) MCG/ACT inhaler, TAKE 2 PUFFS BY MOUTH 4 TIMES A DAY, Disp: , Rfl:     Nebulizer Misc, Dispense one nebulizer machine to include mask and tubing  Sig:  Use for breathing trouble, wheezing Q 4-6 hrs., Disp: 1 each, Rfl: 0    predniSONE (DELTASONE) 20 MG tablet, Take 3 tablets (60 mg total) by mouth daily, Disp: 12 tablet, Rfl:  0    Allergies   Allergen Reactions    Motrin [Ibuprofen]      Makes asthma worse, tolerates asa        Medications and Allergies reviewed.    PHYSICAL EXAM     Vitals:    10/13/20 1245   BP: 138/86   Pulse: 74   Resp: 12   Temp: (!) 96.9 F (36.1 C)   TempSrc: Tympanic   SpO2: 97%   Weight: (!) 181.4 kg (400 lb)   Height: 1.778 m (5' 10" )       Physical Exam  Constitutional:       General: He is not in acute distress.     Appearance: He is well-developed.   HENT:      Head: Normocephalic and atraumatic.     Eyes: Conjunctivae are normal. Cardiovascular:      Rate and Rhythm: Normal rate.   Pulmonary:      Effort: Pulmonary effort is normal.   Musculoskeletal:      Right knee: Bony tenderness present. No swelling. Tenderness present over the medial joint line.        Legs:  Neurological:      Mental Status: He is alert and oriented to person, place, and time.   Skin:     General: Skin is warm.   Vitals and nursing note reviewed.         UCC COURSE     There were no labs reviewed with this patient during the visit.      X-Ray  The following X-ray studies were ordered, visualized and independently interpreted by me. Results were discussed with the patient/family.     XR Knee Right 4+ Views    Result Date: 10/13/2020  XR KNEE RIGHT 4+ VIEWS: 10/13/2020 1:10 PM CLINICAL INFORMATION: right knee pain; tenderness to medial aspect of knee. COMPARISON: None. FINDINGS: No evidence of acute fracture or dislocation. Early degenerative changes in the medial compartment. No evidence of joint effusion.     1. No acute osseous abnormality. 2. Early degenerative changes in the medial compartment. Andrew Maxwell, MD  10/13/2020 1:20 PM      No current facility-administered medications for this visit.       PROCEDURES     Procedures    MEDICAL DECISION MAKING     History, physical, labs/studies most consistent with right knee pain as the diagnosis.    Chart Review:  Prior PCP, Specialist and/or ED notes reviewed today: No  Prior  labs/images/studies reviewed today: Yes, xray    Differential Diagnosis: sprain vs strain, meniscus injury, ACL injury      ASSESSMENT     Encounter Diagnosis   Name Primary?    Acute pain of right knee Yes            PLAN      PLAN:    - Tylenol as needed  - Discussed symptoms to warrant immediate medical attention including worsening pain, swelling, inability to move affected site, numbness/tingling, go to the ER  - Gentle Range of Motion exercises  - Follow up with orthopedic if symptoms persist   - See AVS    Orders Placed This Encounter   Procedures    XR Knee Right 4+ Views     Requested Prescriptions      No prescriptions requested or ordered in this encounter       Discussed results and diagnosis with patient/family.  Reviewed warning signs for worsening condition, as well as, indications for follow-up with primary care physician and return to urgent care clinic.   Patient/family expressed understanding of instructions.     An After Visit Summary was provided to the patient.

## 2020-12-27 ENCOUNTER — Ambulatory Visit (INDEPENDENT_AMBULATORY_CARE_PROVIDER_SITE_OTHER): Payer: 59 | Admitting: Family Medicine

## 2020-12-27 ENCOUNTER — Encounter (INDEPENDENT_AMBULATORY_CARE_PROVIDER_SITE_OTHER): Payer: Self-pay | Admitting: Family Medicine

## 2020-12-27 VITALS — BP 138/89 | HR 92 | Temp 98.7°F | Resp 14 | Wt 395.0 lb

## 2020-12-27 DIAGNOSIS — E119 Type 2 diabetes mellitus without complications: Secondary | ICD-10-CM

## 2020-12-27 DIAGNOSIS — G8929 Other chronic pain: Secondary | ICD-10-CM

## 2020-12-27 DIAGNOSIS — I10 Essential (primary) hypertension: Secondary | ICD-10-CM

## 2020-12-27 DIAGNOSIS — M25561 Pain in right knee: Secondary | ICD-10-CM

## 2020-12-27 DIAGNOSIS — J4540 Moderate persistent asthma, uncomplicated: Secondary | ICD-10-CM

## 2020-12-27 DIAGNOSIS — E7849 Other hyperlipidemia: Secondary | ICD-10-CM

## 2020-12-27 MED ORDER — ADVAIR DISKUS 100-50 MCG/DOSE IN AEPB
1.00 | INHALATION_SPRAY | Freq: Two times a day (BID) | RESPIRATORY_TRACT | 5 refills | Status: DC
Start: 2020-12-27 — End: 2022-04-24

## 2020-12-27 NOTE — Progress Notes (Signed)
Have you seen any specialists/other providers since your last visit with [place]?    No    Arm preference verified?   Yes    The patient is due for nothing at this time, HM is up-to-date.     Patient is here to establish care and to discuss his chronic right knee pain.

## 2020-12-28 DIAGNOSIS — I10 Essential (primary) hypertension: Secondary | ICD-10-CM | POA: Insufficient documentation

## 2020-12-28 DIAGNOSIS — J4540 Moderate persistent asthma, uncomplicated: Secondary | ICD-10-CM | POA: Insufficient documentation

## 2020-12-28 DIAGNOSIS — E119 Type 2 diabetes mellitus without complications: Secondary | ICD-10-CM | POA: Insufficient documentation

## 2020-12-28 DIAGNOSIS — E7849 Other hyperlipidemia: Secondary | ICD-10-CM | POA: Insufficient documentation

## 2020-12-28 NOTE — Progress Notes (Signed)
[place]                       Date of Exam: 12/27/2020 6:48 AM        Patient ID: Andrew Casey is a 50 y.o. male.  Attending Physician: Royanne Foots, DO        Chief Complaint:    Chief Complaint   Patient presents with    Establish Care     knee pain           Assessment:    1. Chronic pain of right knee    2. Moderate persistent asthma, unspecified whether complicated  - Advair Diskus 100-50 MCG/DOSE Aerosol Pwdr, Breath Activated; Inhale 1 puff into the lungs 2 (two) times daily  Dispense: 1 each; Refill: 5    3. Type 2 diabetes mellitus without complication, without long-term current use of insulin    4. Other hyperlipidemia    5. Primary hypertension            Plan:    Suspect medial meniscal injury osteoarthritis as a cause for his knee pain.  Discussed with patient treatment options which include physical therapy, steroid injection or referral onto orthopedics.  He has opted to proceed with a steroid injection however he wishes to wait until the following week.  In the meantime, he may continue to use Voltaren gel and Tylenol along with a TENS unit.  Continue to monitor his blood sugars regularly and advised him to remain on Advair Diskus on a regular basis.  He will remain on his other medicines as well.          Follow-up:    Return in about 11 days (around 01/07/2021) for knee injection, Diabetes.             HPI:    HPI  Patient is a 50 year old male presents for his initial visit to [facility] primary care [place] with complaints of right knee pain.  Symptoms ongoing for at least 3 to 4 months.  Denies any previous injury.  Pain has been slowly worsening over time.  Walking or standing for periods of time will aggravate pain.  Pain mostly located on the medial aspect of the knee but denies any swelling.  He has tried Tylenol, Voltaren gel and TENS unit with minimal improvement.  X-rays were performed approximately 3 months ago that demonstrated early degenerative  changes in the medial compartment.  Patient also has a significant history of asthma.  He has been regularly taking his Advair Diskus inhaler but has recently run out of it.  Typically notes worsening symptoms around the change of season.  Currently denying any wheezing, dyspnea or cough.  Patient also has a significant history of diabetes which seems to be well controlled.  He checks his blood sugars 1-2 times per day and reports readings in the 100-135 range.  Denies polyuria or polydipsia.  No hypoglycemic reactions.               Problem List:    Patient Active Problem List   Diagnosis    Moderate persistent asthma, unspecified whether complicated    Type 2 diabetes mellitus without complication, without long-term current use of insulin    Other hyperlipidemia    Primary hypertension             Current Meds:    Outpatient Medications Marked as Taking for the 12/27/20 encounter (Office Visit) with Stanford Scotland  L, DO   Medication Sig Dispense Refill    albuterol sulfate HFA (PROVENTIL) 108 (90 Base) MCG/ACT inhaler TAKE 2 PUFFS BY MOUTH 4 TIMES A DAY      lisinopril (ZESTRIL) 10 MG tablet Take 10 mg by mouth daily      metFORMIN (GLUCOPHAGE) 500 MG tablet Take 500 mg by mouth 2 (two) times daily with meals Patient takes 2 tablets every evening        rosuvastatin (CRESTOR) 10 MG tablet TAKE 1 TABLET BY MOUTH EVERY DAY TO PREVENT HEART ATTACK AND STROKE            Allergies:    Allergies   Allergen Reactions    Motrin [Ibuprofen]      Makes asthma worse, tolerates asa              Past Surgical History:    Past Surgical History:   Procedure Laterality Date    APPENDECTOMY (OPEN)      SINUS SURGERY                 The following sections were reviewed this encounter by the provider:   Tobacco   Allergies   Meds   Problems   Med Hx   Surg Hx   Fam Hx                ROS:    Review of Systems   Constitutional: Negative for chills and fever.   Respiratory: Negative for cough, shortness of breath and wheezing.     Cardiovascular: Negative for chest pain and palpitations.   Gastrointestinal: Negative for abdominal pain and nausea.   Musculoskeletal: Positive for arthralgias and gait problem. Negative for joint swelling.   Skin: Negative for rash.   Neurological: Negative for dizziness and headaches.            Vital Signs:    BP 138/89    Pulse 92    Temp 98.7 F (37.1 C) (Oral)    Resp 14    Wt (!) 179.2 kg (395 lb)    SpO2 93%    BMI 56.68 kg/m        Physical Exam:    Physical Exam  Constitutional:       General: He is not in acute distress.     Appearance: Normal appearance.   Cardiovascular:      Rate and Rhythm: Normal rate and regular rhythm.      Pulses: Normal pulses.      Heart sounds: No murmur heard.      Pulmonary:      Effort: Pulmonary effort is normal. No respiratory distress.      Breath sounds: Normal breath sounds. No wheezing.   Musculoskeletal:         General: No swelling. Normal range of motion.      Cervical back: Normal range of motion and neck supple. No tenderness.      Right knee: No swelling or effusion. Normal range of motion. Tenderness present over the medial joint line.      Instability Tests: Anterior drawer test negative. Posterior drawer test negative. Anterior Lachman test negative. Medial McMurray test positive. Lateral McMurray test negative.   Neurological:      General: No focal deficit present.      Mental Status: He is alert and oriented to person, place, and time.                Royanne Foots, DO

## 2021-01-07 ENCOUNTER — Ambulatory Visit (INDEPENDENT_AMBULATORY_CARE_PROVIDER_SITE_OTHER): Payer: 59 | Admitting: Family Medicine

## 2021-01-07 ENCOUNTER — Encounter (INDEPENDENT_AMBULATORY_CARE_PROVIDER_SITE_OTHER): Payer: Self-pay | Admitting: Family Medicine

## 2021-01-07 VITALS — BP 147/88 | HR 92 | Temp 98.7°F | Resp 14 | Wt 395.0 lb

## 2021-01-07 DIAGNOSIS — Z1212 Encounter for screening for malignant neoplasm of rectum: Secondary | ICD-10-CM

## 2021-01-07 DIAGNOSIS — M25561 Pain in right knee: Secondary | ICD-10-CM

## 2021-01-07 DIAGNOSIS — E119 Type 2 diabetes mellitus without complications: Secondary | ICD-10-CM

## 2021-01-07 DIAGNOSIS — G8929 Other chronic pain: Secondary | ICD-10-CM

## 2021-01-07 DIAGNOSIS — Z1211 Encounter for screening for malignant neoplasm of colon: Secondary | ICD-10-CM

## 2021-01-07 LAB — POCT HEMOGLOBIN A1C: POCT Hgb A1C: 6.4 % — AB (ref 3.9–5.9)

## 2021-01-07 MED ORDER — TRIAMCINOLONE ACETONIDE 40 MG/ML IJ SUSP
40.00 mg | Freq: Once | INTRAMUSCULAR | Status: AC
Start: 2021-01-07 — End: 2021-01-07
  Administered 2021-01-07: 40 mg via INTRA_ARTICULAR

## 2021-01-07 NOTE — Progress Notes (Signed)
Have you seen any specialists/other providers since your last visit with [place]?    No    Arm preference verified?   Yes    The patient is due for nothing at this time, HM is up-to-date.      Patient is here for his right knee injection.

## 2021-01-11 ENCOUNTER — Encounter (INDEPENDENT_AMBULATORY_CARE_PROVIDER_SITE_OTHER): Payer: Self-pay | Admitting: Family Medicine

## 2021-01-11 NOTE — Progress Notes (Signed)
[place]                       Date of Exam: 01/07/2021 6:56 AM        Patient ID: Andrew Casey is a 50 y.o. male.  Attending Physician: Royanne Foots, DO        Chief Complaint:    Chief Complaint   Patient presents with    Knee Pain           Assessment:    1. Chronic pain of right knee  - triamcinolone acetonide (KENALOG-40) 40 MG/ML injection 40 mg    2. Type 2 diabetes mellitus without complication, without long-term current use of insulin  - POCT Hemoglobin A1C    3. Screening for colorectal cancer  - Ambulatory referral to Gastroenterology            Plan:    With patient's consent under sterile procedure 1 mL of 2% lidocaine without epinephrine and 1 mL of 40 mg triamcinolone was injected into the medial joint space of the right knee.  Patient tolerated the procedure well without complications.  He will notify me in the next few days if he gains any benefit.  Diabetes remains well controlled with hemoglobin A1c of 6.4 today.  We will continue on his current medications and diet.  Patient is also referred for colorectal cancer screening.          Follow-up:    Return in about 3 months (around 04/09/2021), or if symptoms worsen or fail to improve, for Diabetes, knee pain.             HPI:    HPI  Patient is a 50 year old male presents today with persistent right knee pain.  Pain is been ongoing for many months and not improving.  He has used Voltaren gel along with a TENS unit but has not had any improvement.  He is interested in undergoing steroid joint injection today.  Regarding his diabetes he feels is well controlled.  Does not routinely check his blood sugars but denies any polyuria or polydipsia.  No hypoglycemic reactions.  He is compliant with his medicines and unaware of side effects.               Problem List:    Patient Active Problem List   Diagnosis    Moderate persistent asthma, unspecified whether complicated    Type 2 diabetes mellitus without  complication, without long-term current use of insulin    Other hyperlipidemia    Primary hypertension             Current Meds:    Outpatient Medications Marked as Taking for the 01/07/21 encounter (Office Visit) with Royanne Foots, DO   Medication Sig Dispense Refill    Advair Diskus 100-50 MCG/DOSE Aerosol Pwdr, Breath Activated Inhale 1 puff into the lungs 2 (two) times daily 1 each 5    albuterol sulfate HFA (PROVENTIL) 108 (90 Base) MCG/ACT inhaler TAKE 2 PUFFS BY MOUTH 4 TIMES A DAY      Blood Glucose Monitoring Suppl (onetouch verio flex system) w/Device kit       lisinopril (ZESTRIL) 10 MG tablet Take 10 mg by mouth daily      metFORMIN (GLUCOPHAGE) 500 MG tablet Take 500 mg by mouth 2 (two) times daily with meals Patient takes 2 tablets every evening        metFORMIN (GLUCOPHAGE-XR) 500 MG 24  hr tablet       Nebulizer Misc Dispense one nebulizer machine to include mask and tubing    Sig:  Use for breathing trouble, wheezing Q 4-6 hrs. 1 each 0    rosuvastatin (CRESTOR) 10 MG tablet TAKE 1 TABLET BY MOUTH EVERY DAY TO PREVENT HEART ATTACK AND STROKE            Allergies:    Allergies   Allergen Reactions    Motrin [Ibuprofen]      Makes asthma worse, tolerates asa              Past Surgical History:    Past Surgical History:   Procedure Laterality Date    APPENDECTOMY (OPEN)      SINUS SURGERY                 The following sections were reviewed this encounter by the provider:   Tobacco   Allergies   Meds   Problems   Med Hx   Surg Hx   Fam Hx                  ROS:    Review of Systems   Constitutional: Negative for chills and fever.   Respiratory: Negative for cough and shortness of breath.    Cardiovascular: Negative for chest pain and palpitations.   Gastrointestinal: Negative for abdominal pain and nausea.   Musculoskeletal: Positive for arthralgias and gait problem. Negative for joint swelling.   Skin: Negative for rash.   Neurological: Negative for dizziness and headaches.             Vital Signs:    BP 147/88    Pulse 92    Temp 98.7 F (37.1 C) (Oral)    Resp 14    Wt (!) 179.2 kg (395 lb)    SpO2 96%    BMI 56.68 kg/m        Physical Exam:    Physical Exam  Constitutional:       General: He is not in acute distress.     Appearance: Normal appearance.   Cardiovascular:      Rate and Rhythm: Normal rate and regular rhythm.      Pulses: Normal pulses.      Heart sounds: No murmur heard.  Pulmonary:      Effort: Pulmonary effort is normal. No respiratory distress.      Breath sounds: Normal breath sounds. No wheezing.   Musculoskeletal:         General: No swelling.      Cervical back: Normal range of motion and neck supple. No tenderness.      Right knee: Swelling present. Decreased range of motion. Tenderness present over the medial joint line.      Instability Tests: Anterior drawer test negative. Anterior Lachman test negative. Medial McMurray test positive. Lateral McMurray test negative.   Neurological:      General: No focal deficit present.      Mental Status: He is alert and oriented to person, place, and time.                Royanne Foots, DO

## 2021-02-02 ENCOUNTER — Encounter (INDEPENDENT_AMBULATORY_CARE_PROVIDER_SITE_OTHER): Payer: Self-pay | Admitting: Family Medicine

## 2021-02-03 ENCOUNTER — Telehealth (INDEPENDENT_AMBULATORY_CARE_PROVIDER_SITE_OTHER): Payer: Self-pay | Admitting: Family Medicine

## 2021-02-03 ENCOUNTER — Other Ambulatory Visit (INDEPENDENT_AMBULATORY_CARE_PROVIDER_SITE_OTHER): Payer: Self-pay | Admitting: Family Medicine

## 2021-02-03 MED ORDER — METFORMIN HCL 500 MG PO TABS
500.0000 mg | ORAL_TABLET | Freq: Two times a day (BID) | ORAL | 1 refills | Status: DC
Start: 2021-02-03 — End: 2021-02-08

## 2021-02-03 NOTE — Telephone Encounter (Signed)
Confirmed that patient requires refill of his regular metformin NOT the XR.

## 2021-02-03 NOTE — Telephone Encounter (Signed)
[facility] is requesting a call from a nurse to verify the directions for the patients medication    Disp Refills Start End    metFORMIN (GLUCOPHAGE) 500 MG tablet            They are not sure if the patient is supposed to take two pills a day or four a day.    Please advise, thank you.    [place]: [phone]

## 2021-02-04 NOTE — Telephone Encounter (Signed)
Dispense as prescribed.

## 2021-02-04 NOTE — Telephone Encounter (Signed)
Writer spoke to [employer]. Patient is taking Metformin 500 mg once daily. A new script was sent yesterday noted below.    Metformin 500 Mg tablet  Route: Take 1 tablet (500 mg total) by mouth 2 (two) times daily with meals Patient takes 2 tablets every evening - Oral      Pharmacy is looking for clarification. Please advise. Thank you.

## 2021-02-08 MED ORDER — METFORMIN HCL ER 500 MG PO TB24
500.00 mg | ORAL_TABLET | Freq: Every morning | ORAL | 1 refills | Status: DC
Start: 2021-02-08 — End: 2022-04-24

## 2021-02-08 NOTE — Addendum Note (Signed)
Addended by: Nada Boozer, Armin Yerger L. on: 02/08/2021 09:44 AM     Modules accepted: Orders

## 2021-02-08 NOTE — Telephone Encounter (Signed)
rx to be changed to his previous prescription of Metformin XR 500 mg daily

## 2021-03-07 ENCOUNTER — Other Ambulatory Visit (INDEPENDENT_AMBULATORY_CARE_PROVIDER_SITE_OTHER): Payer: Self-pay

## 2021-03-07 MED ORDER — LISINOPRIL 10 MG PO TABS
10.0000 mg | ORAL_TABLET | Freq: Every day | ORAL | 0 refills | Status: DC
Start: 2021-03-07 — End: 2021-07-15

## 2021-03-07 NOTE — Telephone Encounter (Signed)
LOV was 01/07/21. Please advice

## 2021-03-30 ENCOUNTER — Encounter (INDEPENDENT_AMBULATORY_CARE_PROVIDER_SITE_OTHER): Payer: Self-pay | Admitting: Family Medicine

## 2021-03-30 ENCOUNTER — Ambulatory Visit (INDEPENDENT_AMBULATORY_CARE_PROVIDER_SITE_OTHER): Payer: 59 | Admitting: Family Medicine

## 2021-03-30 VITALS — BP 150/94 | HR 86 | Temp 98.5°F | Wt 396.2 lb

## 2021-03-30 DIAGNOSIS — M25561 Pain in right knee: Secondary | ICD-10-CM

## 2021-03-30 DIAGNOSIS — G8929 Other chronic pain: Secondary | ICD-10-CM

## 2021-03-30 DIAGNOSIS — G4730 Sleep apnea, unspecified: Secondary | ICD-10-CM

## 2021-03-30 NOTE — Progress Notes (Unsigned)
Have you seen any specialists/other providers since your last visit with [place]?    No    Arm preference verified?   Yes    The patient is due for colonoscopy     Pt c/o right knee pain 4-10 on the pain scale. Pain is worse at night, during movement and during colder weather. Pt took tylenol for the pain which did not help. Pt gets cortisone shots, last shot was about three months ago. The shots help with the pain.

## 2021-04-03 MED ORDER — TRIAMCINOLONE ACETONIDE 40 MG/ML IJ SUSP
40.00 mg | Freq: Once | INTRAMUSCULAR | Status: DC
Start: 2021-03-30 — End: 2021-04-04

## 2021-04-03 NOTE — Progress Notes (Signed)
[facility] [place]                       Date of Exam: 03/30/2021 8:23 AM        Patient ID: Andrew Casey is a 50 y.o. male.  Attending Physician: Marquette Saa, DO        Chief Complaint:    Chief Complaint   Patient presents with    Knee Pain           Assessment:    1. Chronic pain of right knee  - triamcinolone acetonide (KENALOG-40) 40 MG/ML injection 40 mg    2. Sleep apnea, unspecified type  - Ambulatory referral to Sleep Studies            Plan:    With patient's consent under sterile procedure, 1 mL of 1% lidocaine without epinephrine with 40 mg of triamcinolone was injected into the medial compartment of the right knee.  Patient tolerated the procedure well.  He will also be referred on to sleep medicine for adjustments in his CPAP device.          Follow-up:    Return in about 3 months (around 06/30/2021) for Diabetes.             HPI:    Knee Pain     Patient is a 50 year old male who presents for follow-up of chronic right knee pain as well as concerns of sleep apnea.  Approximately 3 months ago he received an intra-articular steroid injection into his right knee which significantly helped his symptoms.  Over the past 2 weeks has noticed a moderate return of the pain and is requesting another injection.  Denies swelling of the knee.  No giving out or locking up of the knee.  Additionally patient has a history of snoring and waking up unrefreshed.  He does have a long history of sleep apnea but is questioning the settings on his device.  He would like to reestablish with sleep medicine and perhaps undergo another sleep study               Problem List:    Patient Active Problem List   Diagnosis    Moderate persistent asthma, unspecified whether complicated    Type 2 diabetes mellitus without complication, without long-term current use of insulin    Other hyperlipidemia    Primary hypertension             Current Meds:    Outpatient Medications Marked as Taking for the 03/30/21  encounter (Office Visit) with Marquette Saa, DO   Medication Sig Dispense Refill    Advair Diskus 100-50 MCG/DOSE Aerosol Pwdr, Breath Activated Inhale 1 puff into the lungs 2 (two) times daily 1 each 5    Blood Glucose Monitoring Suppl (onetouch verio flex system) w/Device kit       lisinopril (ZESTRIL) 10 MG tablet Take 1 tablet (10 mg total) by mouth daily 90 tablet 0    metFORMIN (GLUCOPHAGE-XR) 500 MG 24 hr tablet Take 1 tablet (500 mg total) by mouth every morning with breakfast 90 tablet 1    Nebulizer Misc Dispense one nebulizer machine to include mask and tubing    Sig:  Use for breathing trouble, wheezing Q 4-6 hrs. 1 each 0    rosuvastatin (CRESTOR) 10 MG tablet TAKE 1 TABLET BY MOUTH EVERY DAY TO PREVENT HEART ATTACK AND STROKE       Current Facility-Administered  Medications for the 03/30/21 encounter (Office Visit) with Marquette Saa, DO   Medication Dose Route Frequency Provider Last Rate Last Admin    [COMPLETED] triamcinolone acetonide (KENALOG-40) 40 MG/ML injection 40 mg  40 mg Intra-articular Once Latricia Heft L, DO   40 mg at 04/04/21 2725    [DISCONTINUED] triamcinolone acetonide (KENALOG-40) 40 MG/ML injection 40 mg  40 mg Intramuscular Once Latricia Heft L, DO              Allergies:    Allergies   Allergen Reactions    Motrin [Ibuprofen]      Makes asthma worse, tolerates asa              Past Surgical History:    Past Surgical History:   Procedure Laterality Date    APPENDECTOMY (OPEN)      SINUS SURGERY                 The following sections were reviewed this encounter by the provider:   Tobacco  Allergies  Meds  Problems  Med Hx  Surg Hx  Fam Hx                 ROS:    Review of Systems   Constitutional:  Negative for chills and fever.   Respiratory:  Negative for cough and shortness of breath.    Cardiovascular:  Negative for chest pain and palpitations.   Gastrointestinal:  Negative for abdominal pain and nausea.   Musculoskeletal:  Positive for arthralgias. Negative for gait  problem and joint swelling.   Skin:  Negative for rash.   Neurological:  Negative for dizziness and headaches.          Vital Signs:    BP (!) 150/94 (BP Site: Left arm, Patient Position: Sitting, Cuff Size: Large)   Pulse 86   Temp 98.5 F (36.9 C) (Oral)   Wt (!) 179.7 kg (396 lb 3.2 oz)   SpO2 94%   BMI 56.85 kg/m        Physical Exam:    Physical Exam  Constitutional:       General: He is not in acute distress.     Appearance: Normal appearance.   Cardiovascular:      Rate and Rhythm: Normal rate and regular rhythm.      Pulses: Normal pulses.      Heart sounds: No murmur heard.  Pulmonary:      Effort: Pulmonary effort is normal. No respiratory distress.      Breath sounds: Normal breath sounds.   Musculoskeletal:      Right knee: No swelling. Decreased range of motion. Tenderness present over the medial joint line.      Instability Tests: Anterior drawer test negative. Anterior Lachman test negative. Medial McMurray test negative and lateral McMurray test negative.   Neurological:      Mental Status: He is alert.              Marquette Saa, DO

## 2021-04-04 ENCOUNTER — Encounter (INDEPENDENT_AMBULATORY_CARE_PROVIDER_SITE_OTHER): Payer: Self-pay | Admitting: Family Medicine

## 2021-04-04 MED ORDER — TRIAMCINOLONE ACETONIDE 40 MG/ML IJ SUSP
40.00 mg | Freq: Once | INTRAMUSCULAR | Status: AC
Start: 2021-03-30 — End: 2021-04-04
  Administered 2021-04-04: 40 mg via INTRA_ARTICULAR

## 2021-04-06 ENCOUNTER — Encounter (INDEPENDENT_AMBULATORY_CARE_PROVIDER_SITE_OTHER): Payer: Self-pay | Admitting: Family Medicine

## 2021-04-20 ENCOUNTER — Encounter (INDEPENDENT_AMBULATORY_CARE_PROVIDER_SITE_OTHER): Payer: Self-pay | Admitting: Sleep Medicine

## 2021-05-04 ENCOUNTER — Ambulatory Visit (INDEPENDENT_AMBULATORY_CARE_PROVIDER_SITE_OTHER): Payer: 59 | Admitting: Family Medicine

## 2021-05-04 ENCOUNTER — Ambulatory Visit (INDEPENDENT_AMBULATORY_CARE_PROVIDER_SITE_OTHER): Payer: 59 | Admitting: Sleep Medicine

## 2021-05-04 ENCOUNTER — Encounter (INDEPENDENT_AMBULATORY_CARE_PROVIDER_SITE_OTHER): Payer: Self-pay | Admitting: Family Medicine

## 2021-05-04 ENCOUNTER — Encounter (INDEPENDENT_AMBULATORY_CARE_PROVIDER_SITE_OTHER): Payer: Self-pay | Admitting: Sleep Medicine

## 2021-05-04 VITALS — BP 142/87 | HR 87 | Ht 70.0 in | Wt 394.0 lb

## 2021-05-04 VITALS — BP 123/79 | HR 81 | Temp 97.5°F | Wt 387.8 lb

## 2021-05-04 DIAGNOSIS — G4733 Obstructive sleep apnea (adult) (pediatric): Secondary | ICD-10-CM

## 2021-05-04 DIAGNOSIS — N529 Male erectile dysfunction, unspecified: Secondary | ICD-10-CM

## 2021-05-04 DIAGNOSIS — M5020 Other cervical disc displacement, unspecified cervical region: Secondary | ICD-10-CM

## 2021-05-04 DIAGNOSIS — M25561 Pain in right knee: Secondary | ICD-10-CM

## 2021-05-04 DIAGNOSIS — G8929 Other chronic pain: Secondary | ICD-10-CM | POA: Insufficient documentation

## 2021-05-04 DIAGNOSIS — Z6841 Body Mass Index (BMI) 40.0 and over, adult: Secondary | ICD-10-CM

## 2021-05-04 MED ORDER — DICLOFENAC SODIUM 1 % EX GEL
4.0000 g | Freq: Four times a day (QID) | CUTANEOUS | 1 refills | Status: DC
Start: 2021-05-04 — End: 2023-08-07

## 2021-05-04 MED ORDER — TADALAFIL 20 MG PO TABS
20.0000 mg | ORAL_TABLET | Freq: Every day | ORAL | 5 refills | Status: DC | PRN
Start: 2021-05-04 — End: 2021-06-13

## 2021-05-04 NOTE — Progress Notes (Signed)
Have you seen any specialists/other providers since your last visit with [place]?    No    Arm preference verified?   Yes    The patient is due for colonoscopy, depression screening, and COVID-19 Vaccine 3rd dose      Pt c/o R knee pain remains unchanged    Pt also wants med for ED     Pt c/o difficulty in moving the right hand fingers, locking and cramping.it has been happening more frequently lately, he does not recall when exactly it started

## 2021-05-04 NOTE — Progress Notes (Signed)
[place]                       Date of Exam: 05/04/2021 3:57 PM        Patient ID: Andrew Casey is a 50 y.o. male.  Attending Physician: Royanne Foots, DO        Chief Complaint:    Chief Complaint   Patient presents with    Knee Pain    hand cramping    Erectile Dysfunction           Assessment:    1. Chronic pain of right knee  - Ambulatory referral to Orthopedic Surgery  - diclofenac Sodium (VOLTAREN) 1 % Gel topical gel; Apply 4 g topically 4 (four) times daily  Dispense: 350 g; Refill: 1    2. Erectile dysfunction, unspecified erectile dysfunction type  - tadalafil (CIALIS) 20 MG tablet; Take 1 tablet (20 mg total) by mouth daily as needed for Erectile Dysfunction  Dispense: 5 tablet; Refill: 5    3. Cervical disc herniation            Plan:    Patient will be referred on to orthopedics for further evaluation of his knee pain.  In the meantime, I have offered him diclofenac gel to be applied 4 times daily to the knee.  We also discussed treatment options for his erectile dysfunction and have offered him tadalafil.  Directions on its use are discussed.  Regarding his hand cramping I did review the MRI from last year that was performed at [facility] which demonstrated a large right-sided cervical disc herniation.  I believe this is causing his symptoms and if it worsens he will let me know.          Follow-up:    Return if symptoms worsen or fail to improve.             HPI:    Knee Pain   Associated symptoms include numbness.   Erectile Dysfunction  Pertinent negatives include no chills.   Patient is a 50 year old male presents today for follow-up of right knee pain.  He received a steroid injection at his last visit and did not find it helpful.  Pain continues to be over the medial aspect of the knee and is worse in the morning.  Usually after moving around it does get better.  Denies giving out or locking of the knee.  Patient also is requesting medication for  erectile dysfunction.  He states his libido is intact but has trouble maintaining and obtaining an erection.  He has used sildenafil but he did not find it helpful.  Additionally patient notes periodic cramping in his right hand.  It locks up at times as well this may occur once a week or so.  Does have some numbness in his index finger and partial related to his right thumb.  This is been ongoing for several months.  He did have an MRI of his neck done last year at and was told there were some abnormalities.               Problem List:    Patient Active Problem List   Diagnosis    Moderate persistent asthma, unspecified whether complicated    Type 2 diabetes mellitus without complication, without long-term current use of insulin    Other hyperlipidemia    Primary hypertension    Morbid obesity    Body mass index  50.0-59.9, adult    Cervical disc herniation    Chronic pain of right knee             Current Meds:    Outpatient Medications Marked as Taking for the 05/04/21 encounter (Office Visit) with Royanne Foots, DO   Medication Sig Dispense Refill    Advair Diskus 100-50 MCG/DOSE Aerosol Pwdr, Breath Activated Inhale 1 puff into the lungs 2 (two) times daily 1 each 5    albuterol sulfate HFA (PROVENTIL) 108 (90 Base) MCG/ACT inhaler continuous prn      Blood Glucose Monitoring Suppl (onetouch verio flex system) w/Device kit       lisinopril (ZESTRIL) 10 MG tablet Take 1 tablet (10 mg total) by mouth daily 90 tablet 0    metFORMIN (GLUCOPHAGE-XR) 500 MG 24 hr tablet Take 1 tablet (500 mg total) by mouth every morning with breakfast 90 tablet 1    Nebulizer Misc Dispense one nebulizer machine to include mask and tubing    Sig:  Use for breathing trouble, wheezing Q 4-6 hrs. 1 each 0    rosuvastatin (CRESTOR) 10 MG tablet TAKE 1 TABLET BY MOUTH EVERY DAY TO PREVENT HEART ATTACK AND STROKE            Allergies:    Allergies   Allergen Reactions    Motrin [Ibuprofen]      Makes asthma worse, tolerates asa               Past Surgical History:    Past Surgical History:   Procedure Laterality Date    APPENDECTOMY (OPEN)      SINUS SURGERY                 The following sections were reviewed this encounter by the provider:   Tobacco   Allergies   Meds   Problems   Med Hx   Surg Hx   Fam Hx                  ROS:    Review of Systems   Constitutional:  Negative for chills and fever.   Respiratory:  Negative for cough and shortness of breath.    Cardiovascular:  Negative for chest pain and palpitations.   Gastrointestinal:  Negative for abdominal pain and nausea.   Musculoskeletal:  Positive for arthralgias and gait problem. Negative for joint swelling.   Skin:  Negative for rash.   Neurological:  Positive for numbness. Negative for dizziness and headaches.          Vital Signs:    BP 123/79 (BP Site: Left arm, Patient Position: Sitting, Cuff Size: Large)    Pulse 81    Temp 97.5 F (36.4 C) (Temporal)    Wt (!) 175.9 kg (387 lb 12.8 oz)    SpO2 94%    BMI 55.64 kg/m        Physical Exam:    Physical Exam  Constitutional:       General: He is not in acute distress.     Appearance: Normal appearance.   Cardiovascular:      Rate and Rhythm: Normal rate and regular rhythm.      Pulses: Normal pulses.      Heart sounds: No murmur heard.  Pulmonary:      Effort: Pulmonary effort is normal. No respiratory distress.      Breath sounds: Normal breath sounds. No wheezing.   Musculoskeletal:  General: No swelling. Normal range of motion.      Cervical back: Normal range of motion and neck supple. No tenderness.      Right knee: No swelling or deformity. Tenderness present over the medial joint line.      Instability Tests: Anterior drawer test negative. Posterior drawer test negative. Anterior Lachman test negative. Medial McMurray test negative and lateral McMurray test negative.   Neurological:      General: No focal deficit present.      Mental Status: He is alert and oriented to person, place, and time.      Deep Tendon Reflexes:       Reflex Scores:       Tricep reflexes are 0 on the right side and 2+ on the left side.       Brachioradialis reflexes are 0 on the right side and 2+ on the left side.     Comments: Negative Spurling sign              Royanne Foots, DO

## 2021-05-04 NOTE — Progress Notes (Signed)
[place] [facility] SLEEP OFFICE CONSULTATION NOTE    [facility] OFF[facility] -CARDIOLOGY  [address]  [place] [address]  Dept: [phone]  Dept Fax: [phone]         Patient Name: Andrew Casey    Date of Visit:  May 04, 2021  Date of Birth: September 05, 1971  AGE: 50 y.o.  Medical Record #: [id number]    Referring Physician: Stanford Scotland, DO    CHIEF COMPLAINT:    Chief Complaint   Patient presents with    Consult (Initial)     Sleep apnea        Sleep appointment was requested today for the purpose of discussing the recent [employer] PAP therapy recall.     Why a Voluntary Recall:  A specialized sound dampening foam used in the device has been shown to possibly degrade into particles which can enter into the device's air-pathway and thus inhaled or ingested by the user. Additionally the foam may ``off-gas certain chemicals as well. Degradation of the foam may be exacerbated by ozone devices for CPAP cleaning or high heat / humidity environments.       Possible Health Risks:  [employer] continues to monitor reports of potential safety issues. To date, no deaths have been reported. The company reported that the rate of patient complaint of symptoms was 0.03% in 2020.      Possible risks to either the particulate exposure or chemical off-gas exposure include but are not limited to:  1. Headaches  2. Irritation and or inflammation of the respiratory system  3. Hypersensitivity   4. Nausea/vomiting   5. Skin irritation  6. Eye irritation   7. Possible toxic and carcinogenic effects.    HISTORY OF PRESENT ILLNESS    Andrew Casey is being seen today for sleep evaluation at the [facility].   50 year old man, new sleep consultation, referred to [place] from primary care Dr. Nada Casey.    Medical history includes chronic knee pain, known sleep apnea, morbid obesity.  He does use a CPAP device which was set up for him around July 2019 through  quality DME, had used regularly until a few months ago.  He also has a diagnosis of primary hypertension, type 2 diabetes on insulin, hyperlipidemia, moderate persistent asthma.    I do have sleep studies to review.  There is a split-night assessment from May 2019 which shows overall AHI of 96 events per hour, minimum O2 61%, subsequently titrated to optimal CPAP pressure 20 cm of water to reduce apneic events with large AirFit F 10 fullface mask.  ECG showed normal sinus rhythm, no PLM's detected.  There is compliance data available on patient's DreamStation 1 auto CPAP device.  This shows last use on 02/05/2021.  Previously set at a pressure of 20 cm of water, 90% over 4-hour use and residual AHI 9.2. Stopped use with recall. Now using old model which he is unsure if works.     Does admitted a CPAP pressure of 20 he does feel that there is a lot of air blowing and occasional aerophagia.  That said he does sleep better with CPAP than without so wants to continue.    PAST MEDICAL HISTORY: He has a past medical history of Asthma, Diabetes mellitus, and Hypertension. He has a past surgical history that includes APPENDECTOMY (OPEN) and Sinus surgery.    ALLERGIES:   Allergies   Allergen Reactions    Motrin [Ibuprofen]  Makes asthma worse, tolerates asa        MEDICATIONS:       Current Outpatient Medications:     Advair Diskus 100-50 MCG/DOSE Aerosol Pwdr, Breath Activated, Inhale 1 puff into the lungs 2 (two) times daily, Disp: 1 each, Rfl: 5    albuterol sulfate HFA (PROVENTIL) 108 (90 Base) MCG/ACT inhaler, TAKE 2 PUFFS BY MOUTH 4 TIMES A DAY, Disp: , Rfl:     Blood Glucose Monitoring Suppl (onetouch verio flex system) w/Device kit, , Disp: , Rfl:     lisinopril (ZESTRIL) 10 MG tablet, Take 1 tablet (10 mg total) by mouth daily, Disp: 90 tablet, Rfl: 0    metFORMIN (GLUCOPHAGE-XR) 500 MG 24 hr tablet, Take 1 tablet (500 mg total) by mouth every morning with breakfast, Disp: 90 tablet, Rfl: 1    Nebulizer Misc,  Dispense one nebulizer machine to include mask and tubing  Sig:  Use for breathing trouble, wheezing Q 4-6 hrs., Disp: 1 each, Rfl: 0    rosuvastatin (CRESTOR) 10 MG tablet, TAKE 1 TABLET BY MOUTH EVERY DAY TO PREVENT HEART ATTACK AND STROKE, Disp: , Rfl:       FAMILY HISTORY: family history includes Cancer in his father; Diabetes in his mother.      SOCIAL HISTORY: He reports that he has quit smoking. He has never used smokeless tobacco. He reports current alcohol use. He reports that he does not use drugs.    REVIEW OF SYSTEMS:   Gen: Denies weight changes, no fatigue  HEENT: denies upper airway procedures. No congestion. No swallowing difficults. No bruxism  Resp: No dyspnea or cough  Cards: No chest pains or palpitation  GI: no n/v/d  G/U: no changes  Neuro: no strength or sensation changes  Skin: no rashes or discoloration  Psych: mood stable      PHYSICAL EXAMINATION    Visit Vitals  BP 142/87 (BP Site: Left arm, Patient Position: Sitting, Cuff Size: Large)   Pulse 87   Ht 1.778 m (5' 10" )   Wt (!) 178.7 kg (394 lb)   BMI 56.53 kg/m        General Appearance:  A well-appearing male in no acute distress.  Does not appear sleepy.   Skin: Warm and dry to touch, no apparent skin lesions, or masses noted.  Head: Normocephalic, normal hair pattern, no masses or tenderness   Eyes: EOMS Intact, PERRL, conjunctivae and lids unremarkable.  Funduscopic exam and visual fields not performed.   ENT: Ears, Nose and throat reveal no gross abnormalities.  No pallor or cyanosis.  Dentition good.  No micrognathia. Large tongue/scalloping. Uvula midline.  Neck: JVP normal, no carotid bruit, thyroid not enlarged   Chest: Clear to auscultation bilaterally with good air movement and respiratory effort and no wheezes, rales, or rhonchi   Cardiovascular: Regular rhythm, S1 normal, S2 normal, No S3 or S4, Apical impulse not displaced, no murmurs, gallops or rubs detected   Abdomen: Soft, nontender, nondistended, with normoactive bowel  sounds. No organomegaly.  No pulsatile masses, or bruits.   Extremities: Warm without edema, clubbing, or cyanosis. All peripheral pulses are full and equal.   Neuro: Alert and oriented x3. No gross motor or sensory deficits noted, affect appropriate.          LABS:   Lab Results   Component Value Date    AST 29 02/03/2020    ALT 44 02/03/2020     No results found for: CBC, IRON, TIBC, FERRITIN  No results found for: TSH      IMPRESSION:   Mr. Bremer is a 50 y.o. male with the following problems:    Known diagnosis sleep apnea by PSG in 2019, AHI 96, O2 nadir 61%  Optimally titrated to CPAP pressure 20 cm of water  Previously compliant with DreamStation 1 on a CPAP device, last used in April 2022  Previously working with quality DME  Compliance data shows on a pressure 20 cmH2O, AHI reduced to 9.2 on average  This is a recalled machine  Morbid obesity  Type 2 diabetes  Hypertension      RECOMMENDATIONS:    Sleep study reviewed together with patient.  Primary care notes reviewed together.  Patient does express understanding of his diagnosis of very severe sleep apnea with nocturnal hypoxemia, pathophysiology, and how it may relate to ongoing cardiovascular conditions and further risk.    We did review the [employer] recall.  He does understand and has registered his machine.  He has not noticed any side effects nor black flecks.  He continues to wait for replacement.  Does have a very old machine which is still somewhat functional and uses this while waiting for replacement.    As there does appear to be residual elevated AHI on CPAP pressure 20, as well as he has the maximal pressure setting I would like to send patient for in lab split night to BPAP at this point.  He may find benefit from BiPAP modality and better improvement of his AHI as well as more comfort.  He is open to this at this time.    Will arrange for split study with usual 1 to 2-week follow-up for test result review and ongoing  management going forward of his sleep condition.  He will continue to use his old device in the meantime.                                                 Orders Placed This Encounter   Procedures    SPLIT NIGHT STUDY ([facility] [place])    Sleep APP Visit ([facility] [place])         No orders of the defined types were placed in this encounter.        SIGNED:    Dennie Maizes, MD         This note was generated by the Dragon speech recognition and may contain errors or omissions not intended by the user. Grammatical errors, random word insertions, deletions, pronoun errors, and incomplete sentences are occasional consequences of this technology due to software limitations. Not all errors are caught or corrected. If there are questions or concerns about the content of this note or information contained within the body of this dictation, they should be addressed directly with the author for clarification.

## 2021-05-13 ENCOUNTER — Encounter (INDEPENDENT_AMBULATORY_CARE_PROVIDER_SITE_OTHER): Payer: Self-pay | Admitting: Family Medicine

## 2021-05-13 DIAGNOSIS — N529 Male erectile dysfunction, unspecified: Secondary | ICD-10-CM

## 2021-05-13 MED ORDER — ROSUVASTATIN CALCIUM 10 MG PO TABS
10.0000 mg | ORAL_TABLET | Freq: Every day | ORAL | 1 refills | Status: DC
Start: 2021-05-13 — End: 2021-11-10

## 2021-05-20 ENCOUNTER — Ambulatory Visit (INDEPENDENT_AMBULATORY_CARE_PROVIDER_SITE_OTHER): Payer: 59

## 2021-05-20 ENCOUNTER — Encounter (INDEPENDENT_AMBULATORY_CARE_PROVIDER_SITE_OTHER): Payer: Self-pay | Admitting: Sleep Medicine

## 2021-05-20 DIAGNOSIS — G4733 Obstructive sleep apnea (adult) (pediatric): Secondary | ICD-10-CM

## 2021-05-21 ENCOUNTER — Encounter (INDEPENDENT_AMBULATORY_CARE_PROVIDER_SITE_OTHER): Payer: Self-pay

## 2021-05-21 ENCOUNTER — Encounter (INDEPENDENT_AMBULATORY_CARE_PROVIDER_SITE_OTHER): Payer: Self-pay | Admitting: Sleep Medicine

## 2021-05-21 NOTE — Progress Notes (Signed)
[facility] OFFICE -CARDIOLOGY  [address]  [place] [address]  Dept: [phone]  Dept Fax: [phone]       Patient Name: Andrew Casey  DOB:   1970/11/15   Age:   50 y.o.   MR:   [id number]     Insurance (s):[employer]  Lab location: [place]  Room number: 3  Completed study type: Split Night Study  Split reason (if applicable): Severe OSA  Mask (s) used : FFM Resmed Airfit F20 L  Supine REM achieved: Yes  Sleep aid : No  O2 nadir: 66%  Companion attended : No    Reason to Expedite: Severity  Lost and found: N/A    Shift summary:   Patient answered NO to all COVID-19 screening questions.    Patient exhibited very severe apneic events, predominantly obstructive apneas, with an oxygen saturation nadir of 66%. Split with CPAP conducted using Resmed CPAP LO3 with pressures ranged from 16-20 cm H2O. The mask used was FFM Resmed Airfit F20 L. Switching CPAP to BiPAP was not warranted, there was no significant events once patient was on CPAP. REM supine sleep was noted ( he assumed supine position only ). PAP therapy was fairly tolerated.

## 2021-06-02 ENCOUNTER — Encounter (INDEPENDENT_AMBULATORY_CARE_PROVIDER_SITE_OTHER): Payer: Self-pay | Admitting: Family

## 2021-06-02 ENCOUNTER — Telehealth (INDEPENDENT_AMBULATORY_CARE_PROVIDER_SITE_OTHER): Payer: 59 | Admitting: Family

## 2021-06-02 DIAGNOSIS — E119 Type 2 diabetes mellitus without complications: Secondary | ICD-10-CM

## 2021-06-02 DIAGNOSIS — I10 Essential (primary) hypertension: Secondary | ICD-10-CM

## 2021-06-02 DIAGNOSIS — G4734 Idiopathic sleep related nonobstructive alveolar hypoventilation: Secondary | ICD-10-CM

## 2021-06-02 DIAGNOSIS — G4761 Periodic limb movement disorder: Secondary | ICD-10-CM

## 2021-06-02 DIAGNOSIS — G4733 Obstructive sleep apnea (adult) (pediatric): Secondary | ICD-10-CM

## 2021-06-02 NOTE — Progress Notes (Addendum)
SLEEP MEDICINE TELEMEDICINE PROGRESS NOTE    HRT [facility] OFFICE      [facility] OFFICE -CARDIOLOGY  [address] [address]  Dept: [phone]  Dept Fax: [phone]       Patient Name: Andrew Casey    Date of Visit:  June 02, 2021  Date of Birth: 08-15-1971  AGE: 50 y.o.  Medical Record #: [id number]  Requesting Physician: Royanne Foots, DO      CHIEF COMPLAINT:  Follow-up (Split TR)       HISTORY OF PRESENT ILLNESS:  The visit today was conducted via telemedicine due to COVID-19 precautions.  The patient was in their home and was informed and gave verbal consent to proceed.    Andrew Casey is a pleasant 50 y.o. male who presents for Sleep Medicine follow-up via telehealth.  He was previously diagnosed with severe obstructive sleep apnea back in 2019.  He was using the Dreamstation1 which became impacted by the recall.  He terminated therapy in April 2022.  He remains untreated.  He was recommended for an updated SPLIT study to assess if he needs BiPAP.  It revealed severe obstructive sleep apnea with AHI/RDI 87.9/h with sustained hypoxemia associated with nadir of 67%.  It showed adequate response to CPAP with recommendation to initiate CPAP of 19 or APAP of 16-20.  Hypoxemia resolved with APAP.  PLM 20.5/h with very low PLM arousal index.      PAST MEDICAL HISTORY: He has a past medical history of Asthma, Diabetes mellitus, and Hypertension. He has a past surgical history that includes APPENDECTOMY (OPEN) and Sinus surgery.    ALLERGIES:   Allergies   Allergen Reactions    Motrin [Ibuprofen]      Makes asthma worse, tolerates asa        MEDICATIONS:   Patient's current medications were reviewed. ONLY Sleep Medicine related medications were updated unless others were addressed in assessment and plan.  Current Outpatient Medications   Medication Instructions    Advair Diskus 100-50 MCG/DOSE Aerosol Pwdr, Breath Activated 1 puff, Inhalation, 2 times daily     albuterol sulfate HFA (PROVENTIL) 108 (90 Base) MCG/ACT inhaler Continuous PRN    Blood Glucose Monitoring Suppl (onetouch verio flex system) w/Device kit No dose, route, or frequency recorded.    diclofenac Sodium (VOLTAREN) 4 g, Topical, 4 times daily    lisinopril (ZESTRIL) 10 mg, Oral, Daily    metFORMIN (GLUCOPHAGE-XR) 500 mg, Oral, Every morning with breakfast    Nebulizer Misc Dispense one nebulizer machine to include mask and tubing<BR><BR>Sig:  Use for breathing trouble, wheezing Q 4-6 hrs.    rosuvastatin (CRESTOR) 10 mg, Oral, Daily    tadalafil (CIALIS) 20 mg, Oral, Daily PRN        PHYSICAL EXAMINATION    Vital Signs: There were no vitals taken for this visit.     Constitutional: Well developed, well nourished, in no acute distress, alert  Skin: no apparent skin lesions    Head: normocephalic   Eyes: conjunctivae and lids normal   Chest: normal respiratory effort     Psychiatric:  normal memory   Neurological: affect appropriate      IMPRESSION:    1.  UPDATED SPLIT(05/20/2021): Very severe obstructive sleep apnea with AHI/RDI 87.9/h with sustained hypoxemia revealing nadir of 67% associated with frequent severe snoring.  Best results achieved on CPAP of 19 improving the AHI/RDI to 6.1/h and nadir of 80%.  Sustained hypoxemia resolved  with APAP.  Supine sleep entire night.  A.  Baseline saturation 93%, saturation below 89% occurred for 96 minutes (51.6%).  Improved to 10 minutes (3.6%) with APAP.  No indication for supplemental oxygen on this night.   B.  Tolerated F 20 fullface mask, large.    C.  Drastic improvement in sleep quality with 3 cycles of REM soon after initiating APAP.     D.  Symptomatic snoring, witnessed apnea, nocturnal dyspnea, leg movements in sleep. ESS of 9.   E.  Tonsils intact.    2. Previously set up with [facility] (Dreamstation1 waiting for recall replacement; 05/02/2018): Terminated.  Registered.  Using and benefitting from therapy.     A.  Last data ends in April 2022: CPAP  of 20 with AHI 9.2/h, mostly hypopnea 9.0/h.     B.  Reports using the F 20 use during the sleep study. Excessive mask leakage 3+ hours.  C.  States he is using another backup machine that is a Philips brand.    3.  Elevated BMI    4.  PLM 20.5/h with minimal PLM arousal index of 2.0/h.  Not causing sleep disturbance.    5.  Moderate persistent asthma.    6.  Hypertension managed through outside clinic.    7.  Diabetes mellitus type 2.    RECOMMENDATION:    We discussed the pathophysiology of sleep apnea as it relates to the cardiovascular system and how treating sleep apnea (when moderate to severe) can help confer reduced future cardiovascular risks.     I explained the SPLIT study was recommended to assess if his current CPAP was inadequate.  It revealed ongoing severe OSA with adequate response to CPAP/APAP.  We could consider auto BiPAP but this would incur additional cost with delay in set up due to limited device.  However, he does have a Dreamstation1 at home he could consider using.      We discussed implications of the [place] recall: I gave him the updates on recall status and announcements made by [employer].  He denies recall symptoms, visible debris, or prior ozone use.  I expressed my significant concern for sustained hypoxemia as he remains untreated.  He states he is using another backup Philips but does not know the model.  If set up in the last 10 years, all Philips machines are recalled.  He may continue therapy taking precautions outlined below.  If both machines are recalled, I recommended he use the Dreamstation1 as long as there is no visible debris.  This will allow [place] to review his data remotely.  He agrees to this plan.  Given the large leakage on his machine on CPAP of 20, adjusting to APAP of 16-20 as recommended by the SPLIT study.  CMN renewed.  DME notified.  If he still feels uncomfortable, he can message me via MyChart and he can rent a shared ResMed machine through an  Facilities manager.  It could be CPAP or he can do an auto BiPAP trial.  Return in 4 months for reevaluation.    Do not to drive or engage in other tasks requiring sustained vigilance if feeling sleepy.    Regarding the Recent Philips-Respironics Recall:    1) The patient was advised to place a recall claim with Respironics and given the contact information to do so. The patient was advised to follow with [employer] ([email or web address]) for updates regarding replacement of their device and also what to look for in terms  of symptoms and the like.     2) The risks and benefits of continuation vs stopping of therapy with a Philip-Respironics device (until provided a replacement by the manufacturer) were discussed with the patient. This was based upon current data from [employer]ips[employer]. The patient understands that every patient has a different risk / benefit profile regarding temporary discontinuation of positive airway pressure therapy (PAP) based upon factors including but not limited to sleep apnea severity, underlying symptoms and health conditions. Possible symptoms / health risks noted by the manufacturer were discussed.      3) Patient was given the option to purchase a new ResMed device or rent one through an Facilities manager.    4) In the case the patient elected to stop their current device until they were provided a replacement they were recommended to:  A) Avoid driving and all other tasks requiring sustained vigilance if feeling sleepy  B) Try to avoid sleeping on their back if possible or consider a wedge pillow or other positional sleep device.   C) Avoid alcohol, muscle relaxants or other sedatives before bedtime as these can exacerbate sleep apnea    5) In the case the patient elected to continue with their current device until they are provided a replacement unit, they were recommended to:  A) Watch for symptoms outlined on the [employer] page (including but not limited to below) or  other concerning symptoms and stop using if those symptoms occured.  B) Use cleaning methods recommended by their DME provider and NOT to use ozone or other alternative cleaning units.  C) Keep the device in a cool environment    Possible Health Risks noted by the manufacturer include but are not limited to:  1) Headaches / Nausea  2) Irritation and or inflammation of the eyes, respiratory system  3) Hypersensitivity   4) Inflammatory response  5) Adverse effects to other organs (kidney, liver)  6) Skin irritation  7) Eye irritation   8) Possible toxic and carcinogenic effects.     ADDENDUM (06/03/21): Patient messaged via MyChart today requesting to move forward with auto-BIPAP. CPAP titration did not normalize the AHI completely at best pressure. Last CPAP download does confirm residual apnea events. CMN written for auto-BiPAP 8-25 CWP with PS of 3 CWP. Getting a BIPAP machine with [facility] is taking more than 6+ months now. May choose [facility] at his preference. Return for initial download 8 weeks after new set up.                                                  Orders Placed This Encounter   Procedures    CPAP and BiPAP Supplies    BI-PAP (DME)    Sleep APP Visit (HRT [place])    DME Set Up Visit (Hrt [place])       No orders of the defined types were placed in this encounter.        SIGNED:    Valetta Fuller, FNP         This note was generated by the Dragon speech recognition and may contain errors or omissions not intended by the user. Grammatical errors, random word insertions, deletions, pronoun errors, and incomplete sentences are occasional consequences of this technology due to software limitations. Not all errors are caught or corrected. If there are questions  or concerns about the content of this note or information contained within the body of this dictation, they should be addressed directly with the author for clarification.

## 2021-06-02 NOTE — Progress Notes (Addendum)
SLEEP MEDICINE TELEMEDICINE PROGRESS NOTE    HRT [facility] OFFICE      [facility] OFFICE -CARDIOLOGY  [address] [address]  Dept: [phone]  Dept Fax: [phone]       Patient Name: Andrew Casey    Date of Visit:  June 03, 2021  Date of Birth: 1971-03-31  AGE: 50 y.o.  Medical Record #: [id number]  Requesting Physician: Royanne Foots, DO      CHIEF COMPLAINT:  Follow-up (Split TR)       HISTORY OF PRESENT ILLNESS:  The visit today was conducted via telemedicine due to COVID-19 precautions.  The patient was in their home and was informed and gave verbal consent to proceed.    Andrew Casey is a pleasant 50 y.o. male who presents for Sleep Medicine follow-up via telehealth.  He was previously diagnosed with severe obstructive sleep apnea back in 2019.  He was using the Dreamstation1 which became impacted by the recall.  He terminated therapy in April 2022.  He remains untreated.  He was recommended for an updated SPLIT study to assess if he needs BiPAP.  It revealed severe obstructive sleep apnea with AHI/RDI 87.9/h with sustained hypoxemia associated with nadir of 67%.  It showed adequate response to CPAP with recommendation to initiate CPAP of 19 or APAP of 16-20.  Hypoxemia resolved with APAP.      PAST MEDICAL HISTORY: He has a past medical history of Asthma, Diabetes mellitus, and Hypertension. He has a past surgical history that includes APPENDECTOMY (OPEN) and Sinus surgery.    ALLERGIES:   Allergies   Allergen Reactions    Motrin [Ibuprofen]      Makes asthma worse, tolerates asa        MEDICATIONS:   Patient's current medications were reviewed. ONLY Sleep Medicine related medications were updated unless others were addressed in assessment and plan.  Current Outpatient Medications   Medication Instructions    Advair Diskus 100-50 MCG/DOSE Aerosol Pwdr, Breath Activated 1 puff, Inhalation, 2 times daily    albuterol sulfate HFA (PROVENTIL) 108 (90  Base) MCG/ACT inhaler Continuous PRN    Blood Glucose Monitoring Suppl (onetouch verio flex system) w/Device kit No dose, route, or frequency recorded.    diclofenac Sodium (VOLTAREN) 4 g, Topical, 4 times daily    lisinopril (ZESTRIL) 10 mg, Oral, Daily    metFORMIN (GLUCOPHAGE-XR) 500 mg, Oral, Every morning with breakfast    Nebulizer Misc Dispense one nebulizer machine to include mask and tubing<BR><BR>Sig:  Use for breathing trouble, wheezing Q 4-6 hrs.    rosuvastatin (CRESTOR) 10 mg, Oral, Daily    tadalafil (CIALIS) 20 mg, Oral, Daily PRN        PHYSICAL EXAMINATION    Vital Signs: There were no vitals taken for this visit.     Constitutional: Well developed, well nourished, in no acute distress, alert  Skin: no apparent skin lesions    Head: normocephalic   Eyes: conjunctivae and lids normal   Chest: normal respiratory effort     Psychiatric:  normal memory   Neurological: affect appropriate      IMPRESSION:    1.  UPDATED SPLIT(05/20/2021): Very severe obstructive sleep apnea with AHI/RDI 87.9/h with sustained hypoxemia revealing nadir of 67% associated with frequent severe snoring.  Best results achieved on CPAP of 19 improving the AHI/RDI to 6.1/h and nadir of 80%.  Sustained hypoxemia resolved with APAP.  Supine sleep entire night.  A.  Baseline saturation 93%, saturation below 89% occurred for 96 minutes (51.6%).  Improved to 10 minutes (3.6%) with APAP.  No indication for supplemental oxygen on this night.   B.  Tolerated F 20 fullface mask, large.    C.  Drastic improvement in sleep quality with 3 cycles of REM soon after initiating APAP.    2. Previously set up with [facility] (Dreamstation1 waiting for recall replacement; 05/02/2018): Terminated.  Registered.  Using and benefitting from therapy.     A.  Last data ends in April 2022: CPAP of 20 with AHI 9.2/h, mostly hypopnea 9.0/h.     B.  Reports using the F 20 use during the sleep study. Excessive mask leakage 3+ hours.  C.  States he is using  another backup machine that is a Philips brand.    3.  Elevated BMI      RECOMMENDATION:    We discussed the pathophysiology of sleep apnea as it relates to the cardiovascular system and how treating sleep apnea (when moderate to severe) can help confer reduced future cardiovascular risks.     I explained the SPLIT study was recommended to assess if his current CPAP was inadequate.  It revealed ongoing severe OSA with adequate response to CPAP/APAP.  We could consider auto BiPAP but this would incur additional cost with delay in set up due to limited device.  However, he does have a Dreamstation1 at home he could consider using.      We discussed implications of the [place] recall: I gave him the updates on recall status and announcements made by [employer].  He denies recall symptoms, visible debris, or prior ozone use.  I expressed my significant concern for sustained hypoxemia as he remains untreated.  He states he is using another backup Philips but does not know the model.  If set up in the last 10 years, all Philips machines are recalled.  He may continue therapy taking precautions outlined below.  If both machines are recalled, I recommended he use the Dreamstation1 as long as there is no visible debris.  This will allow [place] to review his data remotely.  He agrees to this plan.  Given the large leakage on his machine on CPAP of 20, adjusting to APAP of 16-20 as recommended by the SPLIT study.  CMN renewed.  DME notified.  If he still feels uncomfortable, he can message me via MyChart and he can rent a shared ResMed machine through an Facilities manager.  It could be CPAP or he can do an auto BiPAP trial.  Return in 4 months for reevaluation.    Do not to drive or engage in other tasks requiring sustained vigilance if feeling sleepy.    Regarding the Recent Philips-Respironics Recall:    1) The patient was advised to place a recall claim with Respironics and given the contact information to do so. The  patient was advised to follow with [employer] ([email or web address]) for updates regarding replacement of their device and also what to look for in terms of symptoms and the like.     2) The risks and benefits of continuation vs stopping of therapy with a Philip-Respironics device (until provided a replacement by the manufacturer) were discussed with the patient. This was based upon current data from [employer]. The patient understands that every patient has a different risk / benefit profile regarding temporary discontinuation of positive airway pressure therapy (PAP) based upon factors including but not limited to sleep apnea severity, underlying symptoms  and health conditions. Possible symptoms / health risks noted by the manufacturer were discussed.      3) Patient was given the option to purchase a new ResMed device or rent one through an Facilities manager.    4) In the case the patient elected to stop their current device until they were provided a replacement they were recommended to:  A) Avoid driving and all other tasks requiring sustained vigilance if feeling sleepy  B) Try to avoid sleeping on their back if possible or consider a wedge pillow or other positional sleep device.   C) Avoid alcohol, muscle relaxants or other sedatives before bedtime as these can exacerbate sleep apnea    5) In the case the patient elected to continue with their current device until they are provided a replacement unit, they were recommended to:  A) Watch for symptoms outlined on the [employer] page (including but not limited to below) or other concerning symptoms and stop using if those symptoms occured.  B) Use cleaning methods recommended by their DME provider and NOT to use ozone or other alternative cleaning units.  C) Keep the device in a cool environment    Possible Health Risks noted by the manufacturer include but are not limited to:  1) Headaches / Nausea  2) Irritation and or inflammation of  the eyes, respiratory system  3) Hypersensitivity   4) Inflammatory response  5) Adverse effects to other organs (kidney, liver)  6) Skin irritation  7) Eye irritation   8) Possible toxic and carcinogenic effects.     ADDENDUM (06/03/21): Patient messaged via MyChart today requesting to move forward with auto-BIPAP. CPAP titration did not normalize the AHI completely at best pressure. Last CPAP download does confirm residual apnea events. CMN written for auto-BiPAP 8-25 CWP with PS of 3 CWP. Getting a BIPAP machine with [facility] is taking more than 6+ months now. May choose [facility] at his preference. Return for initial download 8 weeks after new set up.                                                Orders Placed This Encounter   Procedures    CPAP and BiPAP Supplies    BI-PAP (DME)    Sleep APP Visit (HRT [place])    DME Set Up Visit (Hrt [place])       No orders of the defined types were placed in this encounter.        SIGNED:    Valetta Fuller, FNP         This note was generated by the Dragon speech recognition and may contain errors or omissions not intended by the user. Grammatical errors, random word insertions, deletions, pronoun errors, and incomplete sentences are occasional consequences of this technology due to software limitations. Not all errors are caught or corrected. If there are questions or concerns about the content of this note or information contained within the body of this dictation, they should be addressed directly with the author for clarification.

## 2021-06-02 NOTE — Patient Instructions (Signed)
Regarding the Recent Philips-Respironics Recall:    1) The patient was advised to place a recall claim with Respironics and given the contact information to do so. The patient was advised to follow with [employer] ([email or web address]) for updates regarding replacement of their device and also what to look for in terms of symptoms and the like.     2) The risks and benefits of continuation vs stopping of therapy with a Philip-Respironics device (until provided a replacement by the manufacturer) were discussed with the patient. This was based upon current data from [employer]. The patient understands that every patient has a different risk / benefit profile regarding temporary discontinuation of positive airway pressure therapy (PAP) based upon factors including but not limited to sleep apnea severity, underlying symptoms and health conditions. Possible symptoms / health risks noted by the manufacturer were discussed.      3) Patient was given the option to purchase a new ResMed device or rent one through an Facilities manager.    4) In the case the patient elected to stop their current device until they were provided a replacement they were recommended to:  A) Avoid driving and all other tasks requiring sustained vigilance if feeling sleepy  B) Try to avoid sleeping on their back if possible or consider a wedge pillow or other positional sleep device.   C) Avoid alcohol, muscle relaxants or other sedatives before bedtime as these can exacerbate sleep apnea    5) In the case the patient elected to continue with their current device until they are provided a replacement unit, they were recommended to:  A) Watch for symptoms outlined on the [employer] page (including but not limited to below) or other concerning symptoms and stop using if those symptoms occured.  B) Use cleaning methods recommended by their DME provider and NOT to use ozone or other alternative cleaning units.  C) Keep the  device in a cool environment    Possible Health Risks noted by the manufacturer include but are not limited to:  1) Headaches / Nausea  2) Irritation and or inflammation of the eyes, respiratory system  3) Hypersensitivity   4) Inflammatory response  5) Adverse effects to other organs (kidney, liver)  6) Skin irritation  7) Eye irritation   8) Possible toxic and carcinogenic effects.

## 2021-06-03 NOTE — Addendum Note (Signed)
Addended by: Valetta Fuller on: 06/03/2021 10:11 AM     Modules accepted: Orders

## 2021-06-06 ENCOUNTER — Telehealth (INDEPENDENT_AMBULATORY_CARE_PROVIDER_SITE_OTHER): Payer: Self-pay

## 2021-06-06 NOTE — Telephone Encounter (Signed)
Emailed to pt.

## 2021-06-06 NOTE — Telephone Encounter (Signed)
-----   Message from Valetta Fuller, [place] sent at 06/02/2021  4:09 PM EDT -----  Regarding: Sleep follow up order: Please email  (#) Copy of sleep study  (#) OSA education with AHI chart  (#) DME referral list  - VERY severe obstructive sleep apnea. We will send your supply order back to [facility]. Please continue ordering supplies through them.     (#) All Philips machine sold in the past 10 years are impacted by the recall. I'm unable to check what back up machine you're using (no remote capacity). Please use your newer Dreamstation1 machine so we can read your data remotely next visit. Read the check out sheets to see what to watch out for.     (#) If you are still concerned about using a Philips product, let me know via MyChart if you are interested in a rental machine with [email or web address] (an Facilities manager). It is a shared RESMED machine that you can pay out of pocket month to month. We can have you continue with CPAP or try out a BIPAP for a low monthly cost which is cheaper than purchasing a new unit. Let us know if you want to go this route.

## 2021-06-07 ENCOUNTER — Encounter (INDEPENDENT_AMBULATORY_CARE_PROVIDER_SITE_OTHER): Payer: Self-pay

## 2021-06-13 MED ORDER — TADALAFIL 20 MG PO TABS
20.0000 mg | ORAL_TABLET | Freq: Every day | ORAL | 11 refills | Status: DC | PRN
Start: 2021-06-13 — End: 2021-12-21

## 2021-06-13 NOTE — Addendum Note (Signed)
Addended by: Nada Boozer, Pranav Lince L. on: 06/13/2021 12:20 PM     Modules accepted: Orders

## 2021-06-16 ENCOUNTER — Telehealth (INDEPENDENT_AMBULATORY_CARE_PROVIDER_SITE_OTHER): Payer: 59 | Admitting: Family

## 2021-06-24 ENCOUNTER — Encounter (INDEPENDENT_AMBULATORY_CARE_PROVIDER_SITE_OTHER): Payer: Self-pay | Admitting: Family Medicine

## 2021-06-27 ENCOUNTER — Ambulatory Visit (INDEPENDENT_AMBULATORY_CARE_PROVIDER_SITE_OTHER): Payer: 59 | Admitting: Family Medicine

## 2021-06-27 ENCOUNTER — Encounter (INDEPENDENT_AMBULATORY_CARE_PROVIDER_SITE_OTHER): Payer: Self-pay | Admitting: Family Medicine

## 2021-06-27 VITALS — BP 131/83 | HR 87 | Temp 97.6°F | Wt 385.0 lb

## 2021-06-27 DIAGNOSIS — M25561 Pain in right knee: Secondary | ICD-10-CM

## 2021-06-27 DIAGNOSIS — G8929 Other chronic pain: Secondary | ICD-10-CM

## 2021-06-27 MED ORDER — TRIAMCINOLONE ACETONIDE 40 MG/ML IJ SUSP
40.0000 mg | Freq: Once | INTRAMUSCULAR | Status: AC
Start: 2021-06-27 — End: 2021-06-27
  Administered 2021-06-27: 40 mg via INTRA_ARTICULAR

## 2021-06-27 NOTE — Progress Notes (Signed)
[place]                       Date of Exam: 06/27/2021 2:23 PM        Patient ID: Andrew Casey is a 50 y.o. male.  Attending Physician: Royanne Foots, DO        Chief Complaint:    Chief Complaint   Patient presents with    Knee Pain     Right knee           Assessment:    1. Chronic pain of right knee  - triamcinolone acetonide (KENALOG-40) 40 MG/ML injection 40 mg            Plan:    With patient's consent under sterile procedure, 40 mg of triamcinolone with 1 mL of 2% lidocaine was injected into the medial joint space of the right knee.  Patient tolerated the procedure well.          Follow-up:    Return if symptoms worsen or fail to improve, for Previously scheduled appointment.             HPI:    Knee Pain   Pertinent negatives include no numbness.   Patient is a 50 year old male presents today for repeat intra-articular steroid injection.  He has received 2 previous injections with the most recent being in June.  He has had significant improvement after the injections however not as much after the second injection as compared to the first.  He is noticing recurrence of stiffness and pain that is aggravated when he first stands up and moves about.               Problem List:    Patient Active Problem List   Diagnosis    Moderate persistent asthma, unspecified whether complicated    Type 2 diabetes mellitus without complication, without long-term current use of insulin    Other hyperlipidemia    Primary hypertension    Morbid obesity    Body mass index 50.0-59.9, adult    Cervical disc herniation    Chronic pain of right knee    OSA (obstructive sleep apnea)    Nocturnal hypoxemia             Current Meds:    Outpatient Medications Marked as Taking for the 06/27/21 encounter (Office Visit) with Royanne Foots, DO   Medication Sig Dispense Refill    Advair Diskus 100-50 MCG/DOSE Aerosol Pwdr, Breath Activated Inhale 1 puff into the lungs 2 (two) times daily 1 each 5     albuterol sulfate HFA (PROVENTIL) 108 (90 Base) MCG/ACT inhaler continuous prn      Blood Glucose Monitoring Suppl (onetouch verio flex system) w/Device kit       lisinopril (ZESTRIL) 10 MG tablet Take 1 tablet (10 mg total) by mouth daily 90 tablet 0    metFORMIN (GLUCOPHAGE-XR) 500 MG 24 hr tablet Take 1 tablet (500 mg total) by mouth every morning with breakfast 90 tablet 1    Nebulizer Misc Dispense one nebulizer machine to include mask and tubing    Sig:  Use for breathing trouble, wheezing Q 4-6 hrs. 1 each 0    rosuvastatin (CRESTOR) 10 MG tablet Take 1 tablet (10 mg total) by mouth daily 90 tablet 1    tadalafil (CIALIS) 20 MG tablet Take 1 tablet (20 mg total) by mouth daily as needed for Erectile Dysfunction 10 tablet 11  Current Facility-Administered Medications for the 06/27/21 encounter (Office Visit) with Royanne Foots, DO   Medication Dose Route Frequency Provider Last Rate Last Admin    [COMPLETED] triamcinolone acetonide (KENALOG-40) 40 MG/ML injection 40 mg  40 mg Intra-articular Once Stanford Scotland L, DO   40 mg at 06/27/21 1241          Allergies:    Allergies   Allergen Reactions    Motrin [Ibuprofen]      Makes asthma worse, tolerates asa              Past Surgical History:    Past Surgical History:   Procedure Laterality Date    APPENDECTOMY (OPEN)      SINUS SURGERY                 The following sections were reviewed this encounter by the provider:   Tobacco   Allergies   Meds   Problems   Med Hx   Surg Hx   Fam Hx                ROS:    Review of Systems   Constitutional:  Negative for chills and fever.   Musculoskeletal:  Positive for arthralgias, gait problem and joint swelling.   Neurological:  Negative for weakness and numbness.          Vital Signs:    BP 131/83 (BP Site: Left arm, Patient Position: Sitting, Cuff Size: Large)    Pulse 87    Temp 97.6 F (36.4 C) (Temporal)    Wt (!) 174.6 kg (385 lb)    SpO2 95%    BMI 55.24 kg/m        Physical Exam:    Physical  Exam  Constitutional:       General: He is not in acute distress.     Appearance: Normal appearance.   Musculoskeletal:      Right knee: Swelling present. No effusion. Normal range of motion. Tenderness present over the medial joint line.   Neurological:      Mental Status: He is alert.              Royanne Foots, DO

## 2021-06-27 NOTE — Progress Notes (Signed)
Have you seen any specialists/other providers since your last visit with [place]?    No    Arm preference verified?   Yes  Health Maintenance Due   Topic Date Due    Colorectal Cancer Screening  Never done    DM OPHTHALMOLOGY EXAM  Never done    URINE MICROALBUMIN  Never done    DEPRESSION SCREENING  Never done    HIGH RISK PNEUMONIA  Never done    Tetanus Ten-Year  05/18/2016    COVID-19 Vaccine (3 - Booster for Pfizer series) 07/16/2020    INFLUENZA VACCINE  05/16/2021

## 2021-07-15 ENCOUNTER — Encounter (INDEPENDENT_AMBULATORY_CARE_PROVIDER_SITE_OTHER): Payer: Self-pay | Admitting: Family Medicine

## 2021-07-15 ENCOUNTER — Ambulatory Visit (INDEPENDENT_AMBULATORY_CARE_PROVIDER_SITE_OTHER): Payer: 59 | Admitting: Family Medicine

## 2021-07-15 ENCOUNTER — Other Ambulatory Visit (INDEPENDENT_AMBULATORY_CARE_PROVIDER_SITE_OTHER): Payer: Self-pay | Admitting: Family Medicine

## 2021-07-15 MED ORDER — LISINOPRIL 10 MG PO TABS
10.0000 mg | ORAL_TABLET | Freq: Every day | ORAL | 0 refills | Status: DC
Start: 2021-07-15 — End: 2021-11-04

## 2021-10-14 ENCOUNTER — Encounter (INDEPENDENT_AMBULATORY_CARE_PROVIDER_SITE_OTHER): Payer: 59 | Admitting: Family Medicine

## 2021-10-21 ENCOUNTER — Encounter (INDEPENDENT_AMBULATORY_CARE_PROVIDER_SITE_OTHER): Payer: 59 | Admitting: Family Medicine

## 2021-10-28 ENCOUNTER — Encounter (INDEPENDENT_AMBULATORY_CARE_PROVIDER_SITE_OTHER): Payer: 59 | Admitting: Family Medicine

## 2021-11-04 ENCOUNTER — Ambulatory Visit (FREE_STANDING_LABORATORY_FACILITY): Payer: 59 | Admitting: Family Medicine

## 2021-11-04 ENCOUNTER — Encounter (INDEPENDENT_AMBULATORY_CARE_PROVIDER_SITE_OTHER): Payer: Self-pay | Admitting: Family Medicine

## 2021-11-04 VITALS — BP 148/91 | HR 85 | Temp 98.4°F | Ht 69.88 in | Wt 390.0 lb

## 2021-11-04 DIAGNOSIS — E7849 Other hyperlipidemia: Secondary | ICD-10-CM

## 2021-11-04 DIAGNOSIS — E119 Type 2 diabetes mellitus without complications: Secondary | ICD-10-CM

## 2021-11-04 DIAGNOSIS — R5383 Other fatigue: Secondary | ICD-10-CM

## 2021-11-04 DIAGNOSIS — Z1212 Encounter for screening for malignant neoplasm of rectum: Secondary | ICD-10-CM

## 2021-11-04 DIAGNOSIS — I10 Essential (primary) hypertension: Secondary | ICD-10-CM

## 2021-11-04 DIAGNOSIS — Z1211 Encounter for screening for malignant neoplasm of colon: Secondary | ICD-10-CM

## 2021-11-04 DIAGNOSIS — Z0000 Encounter for general adult medical examination without abnormal findings: Secondary | ICD-10-CM

## 2021-11-04 DIAGNOSIS — Z8042 Family history of malignant neoplasm of prostate: Secondary | ICD-10-CM

## 2021-11-04 LAB — HEMOGLOBIN A1C
Average Estimated Glucose: 145.6 mg/dL
Hemoglobin A1C: 6.7 % — ABNORMAL HIGH (ref 4.6–5.9)

## 2021-11-04 LAB — LIPID PANEL
Cholesterol / HDL Ratio: 3.5 Index
Cholesterol: 159 mg/dL (ref 0–199)
HDL: 45 mg/dL (ref 40–9999)
LDL Calculated: 94 mg/dL (ref 0–99)
Triglycerides: 100 mg/dL (ref 34–149)
VLDL Calculated: 20 mg/dL (ref 10–40)

## 2021-11-04 LAB — CBC AND DIFFERENTIAL
Absolute NRBC: 0 10*3/uL (ref 0.00–0.00)
Basophils Absolute Automated: 0.07 10*3/uL (ref 0.00–0.08)
Basophils Automated: 1 %
Eosinophils Absolute Automated: 0.56 10*3/uL — ABNORMAL HIGH (ref 0.00–0.44)
Eosinophils Automated: 8.1 %
Hematocrit: 47.3 % (ref 37.6–49.6)
Hgb: 15 g/dL (ref 12.5–17.1)
Immature Granulocytes Absolute: 0.03 10*3/uL (ref 0.00–0.07)
Immature Granulocytes: 0.4 %
Instrument Absolute Neutrophil Count: 3.37 10*3/uL (ref 1.10–6.33)
Lymphocytes Absolute Automated: 2.23 10*3/uL (ref 0.42–3.22)
Lymphocytes Automated: 32.1 %
MCH: 30.8 pg (ref 25.1–33.5)
MCHC: 31.7 g/dL (ref 31.5–35.8)
MCV: 97.1 fL — ABNORMAL HIGH (ref 78.0–96.0)
MPV: 11 fL (ref 8.9–12.5)
Monocytes Absolute Automated: 0.69 10*3/uL (ref 0.21–0.85)
Monocytes: 9.9 %
Neutrophils Absolute: 3.37 10*3/uL (ref 1.10–6.33)
Neutrophils: 48.5 %
Nucleated RBC: 0 /100 WBC (ref 0.0–0.0)
Platelets: 258 10*3/uL (ref 142–346)
RBC: 4.87 10*6/uL (ref 4.20–5.90)
RDW: 14 % (ref 11–15)
WBC: 6.95 10*3/uL (ref 3.10–9.50)

## 2021-11-04 LAB — COMPREHENSIVE METABOLIC PANEL
ALT: 29 U/L (ref 0–55)
AST (SGOT): 24 U/L (ref 5–41)
Albumin/Globulin Ratio: 1.3 (ref 0.9–2.2)
Albumin: 4.2 g/dL (ref 3.5–5.0)
Alkaline Phosphatase: 47 U/L (ref 37–117)
Anion Gap: 11 (ref 5.0–15.0)
BUN: 15 mg/dL (ref 9.0–28.0)
Bilirubin, Total: 0.5 mg/dL (ref 0.2–1.2)
CO2: 25 mEq/L (ref 17–29)
Calcium: 9.7 mg/dL (ref 8.5–10.5)
Chloride: 106 mEq/L (ref 99–111)
Creatinine: 0.8 mg/dL (ref 0.5–1.5)
Globulin: 3.3 g/dL (ref 2.0–3.6)
Glucose: 89 mg/dL (ref 70–100)
Potassium: 4.7 mEq/L (ref 3.5–5.3)
Protein, Total: 7.5 g/dL (ref 6.0–8.3)
Sodium: 142 mEq/L (ref 135–145)

## 2021-11-04 LAB — HEMOLYSIS INDEX: Hemolysis Index: 18 Index (ref 0–24)

## 2021-11-04 LAB — MICROALBUMIN, RANDOM URINE
Urine Creatinine, Random: 135.7 mg/dL
Urine Microalbumin, Random: 22 ug/ml (ref 0.0–30.0)
Urine Microalbumin/Creatinine Ratio: 16 ug/mg (ref 0–30)

## 2021-11-04 LAB — TSH: TSH: 0.94 u[IU]/mL (ref 0.35–4.94)

## 2021-11-04 LAB — PSA: Prostate Specific Antigen, Total: 0.801 ng/mL (ref 0.000–4.000)

## 2021-11-04 LAB — GFR: EGFR: >60

## 2021-11-04 MED ORDER — LISINOPRIL 10 MG PO TABS
20.0000 mg | ORAL_TABLET | Freq: Every day | ORAL | 0 refills | Status: DC
Start: 2021-11-04 — End: 2021-11-10

## 2021-11-04 NOTE — Progress Notes (Unsigned)
Date: 11/04/2021 5:49 PM   Patient ID: Andrew Casey is a 51 y.o. male.         Have you seen any specialists since your last visit with [place]?  No      The patient was informed that the following HM items are still outstanding:   Health Maintenance Due   Topic Date Due    Colorectal Cancer Screening  Never done    DM OPHTHALMOLOGY EXAM  Never done    DEPRESSION SCREENING  Never done    HIGH RISK PNEUMONIA  Never done    Tetanus Ten-Year  05/18/2016    INFLUENZA VACCINE  05/16/2021    COVID-19 Vaccine (4 - Booster for [place] series) 07/10/2021         Subjective:      Chief Complaint:  Chief Complaint   Patient presents with    Annual Exam     Not fasting, last meal 9:00 this morning     Patient presents for annual physical exam.  Is chief complaint today is fatigue.  States he only gets 5 to 7 hours of sleep per night.  Uses a CPAP device but has not had it checked in a while.  Describes daytime somnolence.  Diabetes has been under good control with blood sugars in the 112-117 range fasting.  Denies polyuria or polydipsia.  No hypoglycemic reactions.  Does not routinely check his blood pressures.  Continues to deal with his weight and has not able to make any drastic changes with his diet or exercise.  His knee pain persists which limits his activity levels.    HPI:  Visit Type: Health Maintenance Visit  Work Status: working full-time  Reported Health: fair health  Reported Diet: moderate compliance with well-balanced diet  Reported Exercise: none  Dental: dentist visit > 1 year ago  Vision: no vision correction needed and eye exam < 1 year ago  Hearing: normal hearing  Immunization Status:  COVID-19 Vaccine, Tdap vaccination due, and Influenza vaccination due  Reproductive Health: sexually active  Prior Screening Tests: unsure last PSA and no previous colorectal cancer screening  General Health Risks: family history of prostate cancer, no family history of colon cancer, and no family history of breast  cancer  Safety Elements Used: uses seat belts, smoke detectors in household, carbon monoxide detectors in household, and does not text and drive    Problem List:  Patient Active Problem List   Diagnosis    Moderate persistent asthma, unspecified whether complicated    Type 2 diabetes mellitus without complication, without long-term current use of insulin    Other hyperlipidemia    Primary hypertension    Morbid obesity    Body mass index 50.0-59.9, adult    Cervical disc herniation    Chronic pain of right knee    OSA (obstructive sleep apnea)    Nocturnal hypoxemia       Current Medications:  Outpatient Medications Marked as Taking for the 11/04/21 encounter (Office Visit) with Royanne Foots, DO   Medication Sig Dispense Refill    Advair Diskus 100-50 MCG/DOSE Aerosol Pwdr, Breath Activated Inhale 1 puff into the lungs 2 (two) times daily 1 each 5    albuterol sulfate HFA (PROVENTIL) 108 (90 Base) MCG/ACT inhaler continuous prn      Blood Glucose Monitoring Suppl (onetouch verio flex system) w/Device kit       diclofenac Sodium (VOLTAREN) 1 % Gel topical gel Apply 4 g topically 4 (four) times  daily 350 g 1    metFORMIN (GLUCOPHAGE-XR) 500 MG 24 hr tablet Take 1 tablet (500 mg total) by mouth every morning with breakfast 90 tablet 1    Nebulizer Misc Dispense one nebulizer machine to include mask and tubing    Sig:  Use for breathing trouble, wheezing Q 4-6 hrs. 1 each 0    rosuvastatin (CRESTOR) 10 MG tablet Take 1 tablet (10 mg total) by mouth daily 90 tablet 1    tadalafil (CIALIS) 20 MG tablet Take 1 tablet (20 mg total) by mouth daily as needed for Erectile Dysfunction 10 tablet 11    [DISCONTINUED] lisinopril (ZESTRIL) 10 MG tablet Take 1 tablet (10 mg total) by mouth daily 90 tablet 0       Allergies:  Allergies   Allergen Reactions    Motrin [Ibuprofen]      Makes asthma worse, tolerates asa        Past Medical History:  Past Medical History:   Diagnosis Date    Asthma     Diabetes mellitus     Hypertension         Past Surgical History:  Past Surgical History:   Procedure Laterality Date    APPENDECTOMY (OPEN)      SINUS SURGERY         Family History:  Family History   Problem Relation Age of Onset    Diabetes Mother     Cancer Father         prostate       Social History:  Social History     Tobacco Use    Smoking status: Former    Smokeless tobacco: Never   Brewing technologist Use: Never used   Substance Use Topics    Alcohol use: Yes     Comment: social    Drug use: Never        The following sections were reviewed this encounter by the provider:   Tobacco  Allergies  Meds  Problems  Med Hx  Surg Hx  Fam Hx         ROS:     Review of Systems   Constitutional:  Positive for fatigue. Negative for chills and unexpected weight change.   HENT:  Negative for congestion, ear pain, hearing loss and sore throat.    Eyes:  Negative for redness and visual disturbance.   Respiratory:  Negative for cough, shortness of breath and wheezing.    Cardiovascular:  Negative for chest pain and palpitations.   Gastrointestinal:  Negative for abdominal pain, blood in stool, constipation, diarrhea, nausea and vomiting.   Endocrine: Negative for cold intolerance, polydipsia and polyuria.   Genitourinary:  Negative for difficulty urinating, dysuria, frequency and urgency.   Musculoskeletal:  Positive for arthralgias. Negative for joint swelling and myalgias.   Skin:  Negative for rash.   Neurological:  Negative for dizziness, weakness and headaches.   Hematological:  Negative for adenopathy. Does not bruise/bleed easily.   Psychiatric/Behavioral:  Negative for dysphoric mood and sleep disturbance. The patient is not nervous/anxious.       Objective:     Vitals:  BP (!) 148/91 (BP Site: Right arm, Patient Position: Sitting, Cuff Size: Large)   Pulse 85   Temp 98.4 F (36.9 C) (Temporal)   Ht 1.775 m (5' 9.88")   Wt (!) 176.9 kg (390 lb)   SpO2 95%   BMI 56.15 kg/m       Physical Exam  Constitutional:       General: He is not  in acute distress.     Appearance: Normal appearance.   HENT:      Right Ear: Tympanic membrane and ear canal normal.      Left Ear: Tympanic membrane and ear canal normal.      Nose: Nose normal. No rhinorrhea.      Mouth/Throat:      Mouth: Mucous membranes are moist.      Pharynx: Oropharynx is clear. No posterior oropharyngeal erythema.   Eyes:      Extraocular Movements: Extraocular movements intact.      Conjunctiva/sclera: Conjunctivae normal.      Pupils: Pupils are equal, round, and reactive to light.   Neck:      Thyroid: No thyromegaly.   Cardiovascular:      Rate and Rhythm: Normal rate and regular rhythm.      Pulses: Normal pulses.      Heart sounds: No murmur heard.  Pulmonary:      Effort: Pulmonary effort is normal. No respiratory distress.      Breath sounds: Normal breath sounds. No wheezing.   Abdominal:      General: Abdomen is flat. Bowel sounds are normal. There is no distension.      Palpations: Abdomen is soft. There is no mass.      Tenderness: There is no abdominal tenderness.   Musculoskeletal:         General: No swelling or tenderness. Normal range of motion.      Cervical back: Normal range of motion and neck supple. No rigidity or tenderness.   Lymphadenopathy:      Cervical: No cervical adenopathy.   Skin:     General: Skin is warm and dry.      Findings: No rash.   Neurological:      General: No focal deficit present.      Mental Status: He is alert and oriented to person, place, and time.      Cranial Nerves: No cranial nerve deficit.      Deep Tendon Reflexes: Reflexes normal.   Psychiatric:         Mood and Affect: Mood normal.         Behavior: Behavior normal.          Assessment:       1. Annual physical exam    2. Other fatigue  - CBC and differential  - TSH    3. Type 2 diabetes mellitus without complication, without long-term current use of insulin  - Hemoglobin A1C  - Urine Microalbumin Random    4. Other hyperlipidemia  - Lipid panel    5. Primary hypertension  -  Comprehensive metabolic panel  - lisinopril (ZESTRIL) 10 MG tablet; Take 2 tablets (20 mg) by mouth daily  Dispense: 30 tablet; Refill: 0    6. Morbid obesity    7. Family history of prostate cancer in father  - PSA    8. Screening for colorectal cancer  - Stool Occult Blood, Immunoassay, (Not Guaiac Based); Future      Plan:     Laboratory performed today including CBC, TSH, lipid panel, comprehensive metabolic panel urine for microalbumin and PSA.  We will increase his dose of lisinopril to 20 mg once daily and advised to monitor blood pressures at home.  Encourage diet and exercise.  We discussed colorectal cancer screening methods and he is interested in the FIT testing.  Kit provided  Health Maintenance:   Recommend optimizing low carbohydrate diet efforts and obtaining at least 150 minutes of aerobic exercise per week. Recommend optimizing low sodium diet measures ( less than 2 grams of sodium in the diet per day ).Vision screening UTD. Dental Screening is due. Pt prefers FIT testing over colonoscopy.  FIT ordered. Prostate cancer screening is due.  PSA ordered.    Follow-up:   Return in about 3 months (around 02/02/2022) for Diabetes, Hypertension.     Royanne Foots, DO

## 2021-11-06 ENCOUNTER — Encounter (INDEPENDENT_AMBULATORY_CARE_PROVIDER_SITE_OTHER): Payer: Self-pay | Admitting: Family Medicine

## 2021-11-10 ENCOUNTER — Encounter (INDEPENDENT_AMBULATORY_CARE_PROVIDER_SITE_OTHER): Payer: Self-pay | Admitting: Family Medicine

## 2021-11-10 ENCOUNTER — Other Ambulatory Visit (INDEPENDENT_AMBULATORY_CARE_PROVIDER_SITE_OTHER): Payer: Self-pay | Admitting: Family Medicine

## 2021-11-10 DIAGNOSIS — I10 Essential (primary) hypertension: Secondary | ICD-10-CM

## 2021-11-10 MED ORDER — LISINOPRIL 20 MG PO TABS
20.0000 mg | ORAL_TABLET | Freq: Every day | ORAL | 1 refills | Status: DC
Start: 2021-11-10 — End: 2021-11-11

## 2021-11-11 ENCOUNTER — Telehealth (INDEPENDENT_AMBULATORY_CARE_PROVIDER_SITE_OTHER): Payer: Self-pay | Admitting: Family Medicine

## 2021-11-11 ENCOUNTER — Encounter (INDEPENDENT_AMBULATORY_CARE_PROVIDER_SITE_OTHER): Payer: Self-pay

## 2021-11-11 DIAGNOSIS — I10 Essential (primary) hypertension: Secondary | ICD-10-CM

## 2021-11-11 MED ORDER — LISINOPRIL 20 MG PO TABS
20.0000 mg | ORAL_TABLET | Freq: Every day | ORAL | 1 refills | Status: DC
Start: 2021-11-11 — End: 2022-04-24

## 2021-11-11 NOTE — Telephone Encounter (Signed)
Per pt pharmacy has not obtained order please resend    lisinopril (ZESTRIL) 20 MG tablet      Pharmacy:    [facility], [place], [place] Phone:  [phone]   Fax:  [phone]

## 2021-11-11 NOTE — Telephone Encounter (Signed)
Prescription sent to pharmacy.

## 2021-12-21 ENCOUNTER — Other Ambulatory Visit (INDEPENDENT_AMBULATORY_CARE_PROVIDER_SITE_OTHER): Payer: Self-pay | Admitting: Family Medicine

## 2021-12-21 DIAGNOSIS — N529 Male erectile dysfunction, unspecified: Secondary | ICD-10-CM

## 2021-12-22 MED ORDER — TADALAFIL 20 MG PO TABS
20.0000 mg | ORAL_TABLET | Freq: Every day | ORAL | 11 refills | Status: DC | PRN
Start: 2021-12-22 — End: 2022-07-17

## 2022-02-03 ENCOUNTER — Ambulatory Visit (INDEPENDENT_AMBULATORY_CARE_PROVIDER_SITE_OTHER): Payer: 59 | Admitting: Family Medicine

## 2022-03-01 ENCOUNTER — Encounter (INDEPENDENT_AMBULATORY_CARE_PROVIDER_SITE_OTHER): Payer: Self-pay | Admitting: Family Medicine

## 2022-03-01 ENCOUNTER — Ambulatory Visit (INDEPENDENT_AMBULATORY_CARE_PROVIDER_SITE_OTHER): Payer: 59 | Admitting: Family Medicine

## 2022-03-01 VITALS — BP 142/90 | HR 77 | Temp 98.0°F | Wt 399.0 lb

## 2022-03-01 DIAGNOSIS — J309 Allergic rhinitis, unspecified: Secondary | ICD-10-CM

## 2022-03-01 DIAGNOSIS — L309 Dermatitis, unspecified: Secondary | ICD-10-CM

## 2022-03-01 DIAGNOSIS — Z1211 Encounter for screening for malignant neoplasm of colon: Secondary | ICD-10-CM

## 2022-03-01 DIAGNOSIS — Z1212 Encounter for screening for malignant neoplasm of rectum: Secondary | ICD-10-CM

## 2022-03-01 MED ORDER — TRIAMCINOLONE ACETONIDE 0.1 % EX CREA
TOPICAL_CREAM | CUTANEOUS | 0 refills | Status: DC
Start: 2022-03-01 — End: 2022-04-24

## 2022-03-01 MED ORDER — FLUTICASONE PROPIONATE 50 MCG/ACT NA SUSP
2.0000 | Freq: Every day | NASAL | 1 refills | Status: DC
Start: 2022-03-01 — End: 2022-04-24

## 2022-03-01 NOTE — Progress Notes (Signed)
Have you seen any specialists since your last visit with [place]?  No      The patient was informed that the following HM items are still outstanding:   Health Maintenance Due   Topic Date Due    Colorectal Cancer Screening  Never done    DM OPHTHALMOLOGY EXAM  Never done    DEPRESSION SCREENING  Never done    HEPATITIS C SCREENING  Never done    HIGH RISK PNEUMONIA  Never done    Tetanus Ten-Year  05/18/2016    COVID-19 Vaccine (4 - Booster for [place] series) 07/10/2021     Pt noticed rash about a week ago. Location L lower arm. Itchiness, redness. No changes to creams, soaps, lotions, detergent, etc and does not recall bite from an insect recently  Topical OTC treatments tried: vaseline which helps with the itchiness.

## 2022-03-01 NOTE — Progress Notes (Signed)
[place]                       Date of Exam: 03/01/2022 3:35 PM        Patient ID: Andrew Casey is a 51 y.o. male.  Attending Physician: Royanne Foots, DO        Chief Complaint:    Chief Complaint   Patient presents with    Rash           Assessment:    1. Eczema, unspecified type  - triamcinolone (KENALOG) 0.1 % cream; Apply to affected area twice daily  Dispense: 45 g; Refill: 0    2. Allergic rhinitis, unspecified seasonality, unspecified trigger  - fluticasone (FLONASE) 50 MCG/ACT nasal spray; 2 sprays by Nasal route daily  Dispense: 9.9 g; Refill: 1    3. Screening for colorectal cancer  - Cologuard            Plan:    1. Eczema  -Triamcinolone topical cream twice a day for a week prescribed. If not improved after a week, a referral can be made to a dermatologist.    2. Sinus congestion  -Fluticasone nasal spray two sprays in each nostril once a day prescribed.    3. Colorectal cancer screening  -Reordered Colguard.              Follow-up:    Return in about 2 months (around 05/01/2022) for Diabetes, Hypertension.             HPI:    HPI  Andrew Casey is a 51 year old male who presents for a rash on his left forearm.    The rash has been present about 2.5 weeks. It started out smaller than it is now but has stayed the same size for about a week. At times, it is itchy. If it starts itching, he will put Vaseline on it, which helps the itching, but has not used any other over-the-counter medication. He cannot recall anything he came into contact with that would have caused this rash. He has not taken any new medication. His arm is not painful or swollen.    The patient is monitoring his blood sugar at home, and they usually run between 115-125.    His sinuses have been feeling congested for about three weeks. He has not tried any medication for this, just blowing his nose which will clear it up. There is no ear pain or fullness. He has tried a nasal spray in the  past.    The patient just received his new CPAP machine and has been using it since 02/25/2022. He had mild wheezing one night, but it cleared up.    He inquired about colorectal cancer screening, which was ordered on 02/02/2022. He has not received this.                 Problem List:    Patient Active Problem List   Diagnosis    Moderate persistent asthma, unspecified whether complicated    Type 2 diabetes mellitus without complication, without long-term current use of insulin    Other hyperlipidemia    Primary hypertension    Morbid obesity    Body mass index 50.0-59.9, adult    Cervical disc herniation    Chronic pain of right knee    OSA (obstructive sleep apnea)    Nocturnal hypoxemia             Current  Meds:    Outpatient Medications Marked as Taking for the 03/01/22 encounter (Office Visit) with Royanne Foots, DO   Medication Sig Dispense Refill    Advair Diskus 100-50 MCG/DOSE Aerosol Pwdr, Breath Activated Inhale 1 puff into the lungs 2 (two) times daily 1 each 5    albuterol sulfate HFA (PROVENTIL) 108 (90 Base) MCG/ACT inhaler continuous prn      Blood Glucose Monitoring Suppl (onetouch verio flex system) w/Device kit       diclofenac Sodium (VOLTAREN) 1 % Gel topical gel Apply 4 g topically 4 (four) times daily 350 g 1    lisinopril (ZESTRIL) 20 MG tablet Take 1 tablet (20 mg) by mouth daily 90 tablet 1    metFORMIN (GLUCOPHAGE-XR) 500 MG 24 hr tablet Take 1 tablet (500 mg total) by mouth every morning with breakfast 90 tablet 1    Nebulizer Misc Dispense one nebulizer machine to include mask and tubing    Sig:  Use for breathing trouble, wheezing Q 4-6 hrs. 1 each 0    rosuvastatin (CRESTOR) 10 MG tablet TAKE 1 TABLET BY MOUTH EVERY DAY 90 tablet 1    tadalafil (CIALIS) 20 MG tablet Take 1 tablet (20 mg) by mouth daily as needed for Erectile Dysfunction 10 tablet 11          Allergies:    Allergies   Allergen Reactions    Motrin [Ibuprofen]      Makes asthma worse, tolerates asa              Past  Surgical History:    Past Surgical History:   Procedure Laterality Date    APPENDECTOMY (OPEN)      SINUS SURGERY                 The following sections were reviewed this encounter by the provider:   Tobacco  Allergies  Meds  Problems  Med Hx  Surg Hx  Fam Hx               ROS:    Review of Systems   Constitutional:  Negative for chills and fever.   Respiratory:  Negative for cough and shortness of breath.    Cardiovascular:  Negative for chest pain and palpitations.   Gastrointestinal:  Negative for abdominal pain and nausea.   Musculoskeletal:  Negative for arthralgias and joint swelling.   Skin:  Positive for rash.   Neurological:  Negative for dizziness and headaches.        Vital Signs:    BP 142/90 (BP Site: Right arm, Patient Position: Sitting, Cuff Size: Large)   Pulse 77   Temp 98 F (36.7 C) (Temporal)   Wt (!) 181 kg (399 lb)   SpO2 97%   BMI 57.44 kg/m        Physical Exam:    Physical Exam  Skin:     Findings: Rash (left forearm) present. Rash is macular and papular.      Constitutional:       General: Not in acute distress.     Appearance: Normal appearance.   HENT:      Right Ear: Tympanic membrane and ear canal normal.      Left Ear: Tympanic membrane and ear canal normal.      Nose: Nose normal. Positivie congestion.      Mouth/Throat:      Mouth: Mucous membranes are moist.      Pharynx: Oropharynx is clear. No posterior oropharyngeal erythema.  Cardiovascular:      Rate and Rhythm: Normal rate and regular rhythm.      Pulses: Normal pulses.      Heart sounds: No murmur heard.  Pulmonary:      Effort: Pulmonary effort is normal. No respiratory distress.      Breath sounds: Normal breath sounds. No wheezing.   Musculoskeletal:         General: No swelling. Normal range of motion.      Cervical back: Normal range of motion and neck supple. No tenderness.   Neurological:      General: No focal deficit present.      Mental Status: Alert and oriented to person, place, and  time.    Results:  I have personally reviewed the results with the patient.         Royanne Foots, DO    Transcribed for Dr. Stanford Scotland, by Lossie Faes on 03/01/2022 at 11:48 p.m. Powered by [employer].

## 2022-03-05 ENCOUNTER — Encounter (INDEPENDENT_AMBULATORY_CARE_PROVIDER_SITE_OTHER): Payer: Self-pay | Admitting: Family Medicine

## 2022-03-23 LAB — COLOGUARD: Cologuard Result: NEGATIVE

## 2022-04-24 ENCOUNTER — Other Ambulatory Visit (INDEPENDENT_AMBULATORY_CARE_PROVIDER_SITE_OTHER): Payer: Self-pay | Admitting: Family Medicine

## 2022-04-24 ENCOUNTER — Encounter (INDEPENDENT_AMBULATORY_CARE_PROVIDER_SITE_OTHER): Payer: Self-pay | Admitting: Family Medicine

## 2022-04-24 ENCOUNTER — Ambulatory Visit (INDEPENDENT_AMBULATORY_CARE_PROVIDER_SITE_OTHER): Payer: 59 | Admitting: Family Medicine

## 2022-04-24 VITALS — BP 167/83 | HR 75 | Temp 98.2°F | Wt >= 6400 oz

## 2022-04-24 DIAGNOSIS — E119 Type 2 diabetes mellitus without complications: Secondary | ICD-10-CM

## 2022-04-24 DIAGNOSIS — J4540 Moderate persistent asthma, uncomplicated: Secondary | ICD-10-CM

## 2022-04-24 DIAGNOSIS — I10 Essential (primary) hypertension: Secondary | ICD-10-CM

## 2022-04-24 DIAGNOSIS — E7849 Other hyperlipidemia: Secondary | ICD-10-CM

## 2022-04-24 LAB — POCT HEMOGLOBIN A1C: POCT Hgb A1C: 7 % — AB (ref 3.9–5.9)

## 2022-04-24 MED ORDER — LISINOPRIL-HYDROCHLOROTHIAZIDE 20-25 MG PO TABS
1.0000 | ORAL_TABLET | Freq: Every day | ORAL | 1 refills | Status: DC
Start: 2022-04-24 — End: 2022-09-25

## 2022-04-24 MED ORDER — FLUTICASONE-SALMETEROL 250-50 MCG/ACT IN AEPB
1.0000 | INHALATION_SPRAY | Freq: Two times a day (BID) | RESPIRATORY_TRACT | 5 refills | Status: DC
Start: 2022-04-24 — End: 2022-10-23

## 2022-04-24 NOTE — Progress Notes (Unsigned)
Have you seen any specialists since your last visit with [place]?  No      The patient was informed that the following HM items are still outstanding:   Health Maintenance Due   Topic Date Due    DM OPHTHALMOLOGY EXAM  Never done    DEPRESSION SCREENING  Never done    HEPATITIS C SCREENING  Never done    HIGH RISK PNEUMONIA  Never done    Tetanus Ten-Year  05/18/2016    COVID-19 Vaccine (4 - Pfizer series) 07/10/2021     BP at home averages 140/90  BS at home 118-124 AM fasting  No lifestyle changes per pt

## 2022-04-24 NOTE — Progress Notes (Unsigned)
[facility] [place]                       Date of Exam: 04/24/2022 4:21 PM        Patient ID: Andrew Casey is a 51 y.o. male.  Attending Physician: Marquette Saa, DO        Chief Complaint:    Chief Complaint   Patient presents with    Hypertension     Follow up    Diabetes     Follow up           Assessment:    1. Primary hypertension  - lisinopril-hydroCHLOROthiazide (Zestoretic) 20-25 MG per tablet; Take 1 tablet by mouth daily  Dispense: 90 tablet; Refill: 1    2. Type 2 diabetes mellitus without complication, without long-term current use of insulin  - POCT Hemoglobin A1C    3. Moderate persistent asthma, unspecified whether complicated  - fluticasone-salmeterol (ADVAIR DISKUS) 250-50 MCG/ACT Aerosol Pwdr, Breath Activated; Inhale 1 puff into the lungs every 12 (twelve) hours  Dispense: 1 each; Refill: 5    4. Other hyperlipidemia            Plan:    1.  Breathing:  -Advair Diskus dose is increased and to be used 1 puff in the morning and 1 puff at night.  -Remember to rinse mouth after use.      2.  Diabetes:  -He will schedule an eye exam.   -A1c is done today.     3.  Plantar fasciitis:  -He will obtain shoe inserts.     4. Hypertension:  -Combination pill with lisinopril/hydrochlorothiazide is prescribed.     5. Follow-up:   Follow-up in 2 to 3 months for blood pressure check.           Follow-up:    Return in about 3 months (around 07/25/2022) for Diabetes, Hypertension.             HPI:    HPI    Andrew Casey is a 51 year old male here for a follow-up visit for his diabetes and blood pressure.     His blood pressure today was high at 167/83.  Usually at home he has 140 to 150 over 85 to 92.  Recheck was 162/100.  He is only on lisinopril.     At home, his blood sugars run 118 to 125 at 3 or 4 in the morning before work.       HIs breathing has been heavy and his deep breaths are not as deep.  He uses the Advair Diskus in the morning and his albuterol inhaler throughout the  day, maybe twice.      Vision is okay, but he sees farther with his left eye that his right.  He has last seen an eye doctor 2 years ago.     His knee is about the same.  He has heel pain and was going to get an insert.  He believes he has plantar fasciitis as he has had this before and had to use insoles.     Labs were last done in January.   He did his Cologuard test.     {chronic conditions (Optional):50350}           Problem List:    Patient Active Problem List   Diagnosis    Moderate persistent asthma, unspecified whether complicated    Type 2 diabetes mellitus without complication, without long-term  current use of insulin    Other hyperlipidemia    Primary hypertension    Morbid obesity    Body mass index 50.0-59.9, adult    Cervical disc herniation    Chronic pain of right knee    OSA (obstructive sleep apnea)    Nocturnal hypoxemia             Current Meds:    Outpatient Medications Marked as Taking for the 04/24/22 encounter (Office Visit) with Marquette SaaKent, Akiva Brassfield L, DO   Medication Sig Dispense Refill    albuterol sulfate HFA (PROVENTIL) 108 (90 Base) MCG/ACT inhaler continuous prn      Blood Glucose Monitoring Suppl (onetouch verio flex system) w/Device kit       diclofenac Sodium (VOLTAREN) 1 % Gel topical gel Apply 4 g topically 4 (four) times daily 350 g 1    metFORMIN (GLUCOPHAGE-XR) 500 MG 24 hr tablet Take 1 tablet (500 mg total) by mouth every morning with breakfast 90 tablet 1    Nebulizer Misc Dispense one nebulizer machine to include mask and tubing    Sig:  Use for breathing trouble, wheezing Q 4-6 hrs. 1 each 0    rosuvastatin (CRESTOR) 10 MG tablet TAKE 1 TABLET BY MOUTH EVERY DAY 90 tablet 1    tadalafil (CIALIS) 20 MG tablet Take 1 tablet (20 mg) by mouth daily as needed for Erectile Dysfunction 10 tablet 11    [DISCONTINUED] Advair Diskus 100-50 MCG/DOSE Aerosol Pwdr, Breath Activated Inhale 1 puff into the lungs 2 (two) times daily 1 each 5    [DISCONTINUED] lisinopril (ZESTRIL) 20 MG tablet  Take 1 tablet (20 mg) by mouth daily 90 tablet 1          Allergies:    Allergies   Allergen Reactions    Motrin [Ibuprofen]      Makes asthma worse, tolerates asa              Past Surgical History:    Past Surgical History:   Procedure Laterality Date    APPENDECTOMY (OPEN)      SINUS SURGERY                 The following sections were reviewed this encounter by the provider:              ROS:    Review of Systems   Constitutional:  Negative for chills and fever.   Respiratory:  Positive shortness of breath/heavy breath and wheezing.  Negative for cough.    Cardiovascular:  Negative for chest pain and palpitations.   Gastrointestinal:  Negative for abdominal pain and nausea.   Musculoskeletal:  Positive knee and heel pain.    Skin:  Negative for rash.   Neurological:  Negative for dizziness and headaches.        Vital Signs:    BP 167/83 (BP Site: Left arm, Patient Position: Sitting, Cuff Size: X-Large)   Pulse 75   Temp 98.2 F (36.8 C) (Temporal)   Wt (!) 182.1 kg (401 lb 6.4 oz)   SpO2 95%   BMI 57.79 kg/m        Physical Exam:    Physical Exam   Constitutional:       General: Not in acute distress.     Appearance: Normal appearance.   Cardiovascular:      Rate and Rhythm: Normal rate and regular rhythm.      Pulses: Normal pulses.      Heart sounds: No  murmur heard.  Pulmonary:      Effort: Pulmonary effort is normal. No respiratory distress.      Breath sounds: Positive wheezing. Normal breath sounds.   Musculoskeletal:         General: No swelling. Normal range of motion.      Cervical back: Normal range of motion and neck supple. No tenderness.   Neurological:      General: No focal deficit present.      Mental Status: Alert and oriented to person, place, and time.          Marquette Saa, DO    Transcribed for Dr. Latricia Heft, by Corinne Ports on 04/25/2022 at 11:58 am. Powered by [employer].

## 2022-04-26 ENCOUNTER — Encounter (INDEPENDENT_AMBULATORY_CARE_PROVIDER_SITE_OTHER): Payer: Self-pay | Admitting: Family Medicine

## 2022-04-26 MED ORDER — METFORMIN HCL ER 500 MG PO TB24
500.0000 mg | ORAL_TABLET | Freq: Every morning | ORAL | 1 refills | Status: DC
Start: 2022-04-26 — End: 2022-10-23

## 2022-07-17 ENCOUNTER — Other Ambulatory Visit (INDEPENDENT_AMBULATORY_CARE_PROVIDER_SITE_OTHER): Payer: Self-pay | Admitting: Family Medicine

## 2022-07-17 DIAGNOSIS — N529 Male erectile dysfunction, unspecified: Secondary | ICD-10-CM

## 2022-07-17 DIAGNOSIS — E7849 Other hyperlipidemia: Secondary | ICD-10-CM

## 2022-07-17 MED ORDER — TADALAFIL 20 MG PO TABS
20.0000 mg | ORAL_TABLET | Freq: Every day | ORAL | 0 refills | Status: DC | PRN
Start: 2022-07-17 — End: 2022-09-25

## 2022-07-17 MED ORDER — ROSUVASTATIN CALCIUM 10 MG PO TABS
10.0000 mg | ORAL_TABLET | Freq: Every day | ORAL | 0 refills | Status: DC
Start: 2022-07-17 — End: 2022-08-14

## 2022-07-17 NOTE — Telephone Encounter (Signed)
LRF rosuvastatin 10 mg: 11/10/21 for qty of 90 and 1 rf    LRF tadalafil 20 mg: 12/22/21 for qty of 10 and 11 rf    LOV: 04/24/22    Queued rosuvastatin for qty of 90 and 1 rf    Queued tadalafil for qty of 10 and 11 rf    Future Appt: 07/28/22

## 2022-07-17 NOTE — Telephone Encounter (Signed)
Last o/v July 2023.  Patient has an upcoming appointment on July 28 2022.    Last filled January 2023.   Queued up 30 with 0 refills.    Last filled March 2023.  Queued up 10 with 0 refills.    Last Primary Care Labs:   Lab Results   Component Value Date    WBC 6.95 11/04/2021    HGB 15.0 11/04/2021    HCT 47.3 11/04/2021    PLT 258 11/04/2021    CHOL 159 11/04/2021    TRIG 100 11/04/2021    HDL 45 11/04/2021    LDL 94 11/04/2021    ALT 29 11/04/2021    AST 24 11/04/2021    NA 142 11/04/2021    K 4.7 11/04/2021    CL 106 11/04/2021    CREAT 0.8 11/04/2021    BUN 15.0 11/04/2021    CO2 25 11/04/2021    TSH 0.94 11/04/2021    PSA 0.801 11/04/2021    GLU 89 11/04/2021    HGBA1C 7.0 (A) 04/24/2022

## 2022-07-28 ENCOUNTER — Ambulatory Visit (INDEPENDENT_AMBULATORY_CARE_PROVIDER_SITE_OTHER): Payer: 59 | Admitting: Family Medicine

## 2022-08-04 ENCOUNTER — Encounter (INDEPENDENT_AMBULATORY_CARE_PROVIDER_SITE_OTHER): Payer: Self-pay | Admitting: Family Medicine

## 2022-08-05 ENCOUNTER — Emergency Department: Payer: Self-pay

## 2022-08-05 ENCOUNTER — Emergency Department
Admission: EM | Admit: 2022-08-05 | Discharge: 2022-08-05 | Disposition: A | Discharge status: Home / Self Care | Payer: Self-pay | Attending: Emergency Medicine | Admitting: Emergency Medicine

## 2022-08-05 DIAGNOSIS — J4521 Mild intermittent asthma with (acute) exacerbation: Secondary | ICD-10-CM | POA: Insufficient documentation

## 2022-08-05 DIAGNOSIS — Z87891 Personal history of nicotine dependence: Secondary | ICD-10-CM | POA: Insufficient documentation

## 2022-08-05 DIAGNOSIS — E119 Type 2 diabetes mellitus without complications: Secondary | ICD-10-CM | POA: Insufficient documentation

## 2022-08-05 DIAGNOSIS — R0602 Shortness of breath: Secondary | ICD-10-CM

## 2022-08-05 DIAGNOSIS — Z1152 Encounter for screening for COVID-19: Secondary | ICD-10-CM | POA: Insufficient documentation

## 2022-08-05 DIAGNOSIS — Z7984 Long term (current) use of oral hypoglycemic drugs: Secondary | ICD-10-CM | POA: Insufficient documentation

## 2022-08-05 LAB — COVID-19 (SARS-COV-2) & INFLUENZA  A/B, NAA (ROCHE LIAT)
Influenza A: NOT DETECTED
Influenza B: NOT DETECTED
SARS CoV 2 Overall Result: NOT DETECTED

## 2022-08-05 MED ORDER — IPRATROPIUM BROMIDE 0.02 % IN SOLN
0.5000 mg | Freq: Once | RESPIRATORY_TRACT | Status: AC
Start: 2022-08-05 — End: 2022-08-05
  Administered 2022-08-05: 0.5 mg via RESPIRATORY_TRACT
  Filled 2022-08-05: qty 2.5

## 2022-08-05 MED ORDER — ALBUTEROL SULFATE HFA 108 (90 BASE) MCG/ACT IN AERS
2.0000 | INHALATION_SPRAY | RESPIRATORY_TRACT | 0 refills | Status: DC | PRN
Start: 2022-08-05 — End: 2023-02-02

## 2022-08-05 MED ORDER — PREDNISONE 20 MG PO TABS
60.0000 mg | ORAL_TABLET | Freq: Every day | ORAL | 0 refills | Status: AC
Start: 2022-08-05 — End: 2022-08-08

## 2022-08-05 MED ORDER — ALBUTEROL SULFATE (2.5 MG/3ML) 0.083% IN NEBU
5.0000 mg | INHALATION_SOLUTION | Freq: Once | RESPIRATORY_TRACT | Status: AC
Start: 2022-08-05 — End: 2022-08-05
  Administered 2022-08-05: 5 mg via RESPIRATORY_TRACT
  Filled 2022-08-05: qty 6

## 2022-08-05 MED ORDER — PREDNISONE 20 MG PO TABS
60.0000 mg | ORAL_TABLET | Freq: Once | ORAL | Status: AC
Start: 2022-08-05 — End: 2022-08-05
  Administered 2022-08-05: 60 mg via ORAL
  Filled 2022-08-05: qty 3

## 2022-08-05 NOTE — ED Provider Notes (Signed)
[facility]  ATTENDING PHYSICIAN HISTORY AND PHYSICAL EXAM     Patient Name: Andrew Casey, Andrew Casey  Age: 51 y.o. male  Department:[facility]  Encounter Date:  08/05/2022   Attending Physician: Horatio Pel, MD is the primary attending for this patient and has obtained and performed the history, PE, and medical decision making for this patient.     HISTORY OF PRESENT ILLNESS   Chief Complaint: Shortness of Breath     MANSOOR HILLYARD is Casey 51 y.o. male who presents to the ED with SOB;   patient has Casey past medical history of asthma, diabetes, hypertension.  Patient has an inhaler as well as nebulizer at home.  Reports that for the last 1 week he has been feeling short of breath, wheezing, coughing--symptoms that are all typical of his asthma.  States that this typically gets worse when the weather changes.  Cough is nonproductive.  No hemoptysis.  No fevers or chills.  No nausea or vomiting.  No diarrhea.  No chest pain/pressure/heaviness/tightness; No SOB/trouble breathing; No Pleuritic pain or symptoms;  No LE pain or swelling; no abdominal pain or back pain.  Patient has been using his inhaler 3 times daily and using his neb machine just prior to bedtime.  No recent steroids.    [facility].  No treatment prior to arrival.      Triage: Pt POV with SOB, [facility]. Pt c/o denies pain or distress ambulated and able to speak in full sentences O2 saturations att 96% on arrival.       History obtained from: Patient;  Onset of Symptoms: 1 week  Duration of Symptoms: See above  Context: see above  Quality: Shortness of breath, wheezing, coughing  Location: Pulmonary  Radiation: none  Severity: Mild to moderate  Aggravating Factors: None  Alleviating Factors: Inhaler  Associated Symptoms: No fevers    ALL:   Allergies   Allergen Reactions    Motrin [Ibuprofen]      Makes asthma worse, tolerates asa       PMH: Confirmed  with the patient and updated as below  PSH: Confirmed with the patient and updated as below  Social History: Confirmed with the patient and updated as below      MEDICAL HISTORY     Past Medical History:  Past Medical History:   Diagnosis Date    Asthma     Diabetes mellitus     Hypertension        Past Surgical History:  Past Surgical History:   Procedure Laterality Date    APPENDECTOMY (OPEN)      SINUS SURGERY         Social History:  Social History     Socioeconomic History    Marital status: Married   Tobacco Use    Smoking status: Former    Smokeless tobacco: Never   Haematologist Use: Never used   Substance and Sexual Activity    Alcohol use: Yes     Comment: social    Drug use: Never    Sexual activity: Yes       Family History:  Family History   Problem Relation Age of Onset    Diabetes Mother     Cancer Father         prostate       Outpatient Medication:  Previous  Medications    ALBUTEROL SULFATE HFA (PROVENTIL) 108 (90 BASE) MCG/ACT INHALER    continuous prn    BLOOD GLUCOSE MONITORING SUPPL (ONETOUCH VERIO FLEX SYSTEM) W/DEVICE KIT        DICLOFENAC SODIUM (VOLTAREN) 1 % GEL TOPICAL GEL    Apply 4 g topically 4 (four) times daily    FLUTICASONE-SALMETEROL (ADVAIR DISKUS) 250-50 MCG/ACT AEROSOL PWDR, BREATH ACTIVATED    Inhale 1 puff into the lungs every 12 (twelve) hours    LISINOPRIL-HYDROCHLOROTHIAZIDE (ZESTORETIC) 20-25 MG PER TABLET    Take 1 tablet by mouth daily    METFORMIN (GLUCOPHAGE-XR) 500 MG 24 HR TABLET    Take 1 tablet (500 mg) by mouth every morning with breakfast    NEBULIZER MISC    Dispense one nebulizer machine to include mask and tubing    Sig:  Use for breathing trouble, wheezing Q 4-6 hrs.    ROSUVASTATIN (CRESTOR) 10 MG TABLET    Take 1 tablet (10 mg) by mouth daily    TADALAFIL (CIALIS) 20 MG TABLET    Take 1 tablet (20 mg) by mouth daily as needed for Erectile Dysfunction       PHYSICAL EXAM     ED Triage Vitals [08/05/22 1348]   Enc Vitals Group      BP 121/72      Heart  Rate 92      Resp Rate 20      Temp       Temp src       SpO2 95 %      Weight (!) 178.9 kg      Height 1.778 m      Head Circumference       Peak Flow       Pain Score 0      Pain Loc       Pain Edu?       Excl. in GC?        Vitals:    08/05/22 1348   BP: 121/72   Pulse: 92   Resp: 20   SpO2: 95%   Weight: (!) 178.9 kg   Height: 5\' 10"  (1.778 m)         Physical Exam  Vitals and nursing note reviewed.   Constitutional:       General: He is not in acute distress.     Appearance: Normal appearance. He is not ill-appearing or toxic-appearing.      Comments: Awake and alert, NAD; Non-toxic; No resp distress;   Answers all questions appropriately     HENT:      Head: Normocephalic and atraumatic.      Mouth/Throat:      Mouth: Mucous membranes are moist.      Pharynx: Oropharynx is clear.   Eyes:      General: No scleral icterus.     Extraocular Movements: Extraocular movements intact.      Conjunctiva/sclera: Conjunctivae normal.      Pupils: Pupils are equal, round, and reactive to light.   Neck:      Comments: Neck supple and no meningismus   Cardiovascular:      Rate and Rhythm: Normal rate and regular rhythm.      Pulses: Normal pulses.      Heart sounds: Normal heart sounds. No murmur heard.  Pulmonary:      Effort: Pulmonary effort is normal. No respiratory distress.      Breath sounds: No stridor. Wheezing (mild diffuse) present. No  rhonchi or rales.   Abdominal:      General: Abdomen is flat. There is no distension.      Palpations: Abdomen is soft.      Tenderness: There is no abdominal tenderness. There is no guarding or rebound.   Musculoskeletal:         General: No swelling or tenderness. Normal range of motion.      Cervical back: Normal range of motion and neck supple. No rigidity or tenderness.      Right lower leg: No edema.      Left lower leg: No edema.      Comments: No calf tenderness   Skin:     General: Skin is warm and dry.      Capillary Refill: Capillary refill takes less than 2 seconds.       Coloration: Skin is not jaundiced or pale.      Findings: No rash.   Neurological:      General: No focal deficit present.      Mental Status: He is alert and oriented to person, place, and time. Mental status is at baseline.      Motor: No weakness.   Psychiatric:         Mood and Affect: Mood normal.         MEDICAL DECISION MAKING/ED COURSE   -->I personally reviewed Nursing Notes, vital signs and pulse oximetry;   -->I personally reviewed previous/outside records as available  -->I independently reviewed and interpreted the EKG:  "Cardiac Studies" below    -->Oxygen Saturation by Pulse Oximetry: 95%  On RA  Indicating normal oxygenation;  Interventions needed: None    -->Pt seen/examined on arrival to the ED; ***  --Patient with history of diabetes, hypertension, asthma and now with signs and symptoms that are most consistent with asthma exacerbation.  Patient has been wheezing, coughing, feeling short of breath.  Temporary relief with his inhalers.  --DDX discussed in detail;   --Testing for COVID/flu  --Chest x-ray ordered due to prolonged course  -- Albuterol nebs, Atrovent nebs  -- P.o. steroids    4:16 PM--re-eval:  --Has enough albuterol neb solution at home    Re-eval:  Patient feeling better after treatment.   At discharge, pt looked well, nontoxic, no distress  All Results discussed w/ patient/family. Counseled to follow up on any abnormalities   Pt is ambulatory on d/c without difficulty. Good PO.  Pt counseled extensively  Return precautions discussed in detail  All questions were answered and patient feels comfortable with the plan;   Patient counseled on any/all medications given.  Risks/benefits of any prescriptions discussed.            DATA REVIEWED     Vital Signs: Reviewed the patient's vital signs.   Nursing Notes: Reviewed and utilized available nursing notes.  Medical Records Reviewed: Reviewed available past medical records if available.     RADIOLOGY IMAGING STUDIES      No results  found.    LABORATORY RESULTS    Ordered and independently interpreted AVAILABLE laboratory tests. Please see results section in chart for full details.    Results       ** No results found for the last 24 hours. **                  Medical Decision Making  Amount and/or Complexity of Data Reviewed  Labs: ordered.  Radiology: ordered.    Risk  Prescription drug management.  Number and Complexity of Problems Addressed (select at least one)    {Complexity:58839}    Presenting acute/chronic problems: ***    DDX: ***    Chronic illness impacting care (obesity, diabetes, hypertension, elderly - state impact): patient   has Casey past medical history of Asthma, Diabetes mellitus, and Hypertension.      Amount/complexity of Data Reviewed      Labs/Imaging Ordered during this visit: No orders of the defined types were placed in this encounter.       Labs reviewed during this visit: see above    History obtained from another historian (parent, spouse,  care giver, ems) : ***    Review of older external records from *** and found this relevant information: ***                          Cardiac/Radiologic Studies    The following cardiac studies were independently interpreted by the Emergency Medicine Physician.  For full cardiac study results please see chart.    Cardiac Monitor Strip: Interpreted by ED Physician  Rate: ***  Rhythm: NSR   ST Changes: none    Independent interpretation of  EKG: by Karle Plumber, MD  Rate: Normal for age.  Rhythm: Normal sinus rhythm  PR, QRS and QT intervals:  normal for age and rate  ST Segments: No deviations suggestive of ischemia;  *** No STEMI  Comparison: ***  Impression: Normal ECG with no evidence of ischemia.  Attending : Dr. Karle Plumber    Independent  interpretation of radiological study by me:      Type of xray performed : ***  Independent Interpretation by me: *** ; Will wait for official radiology report    Diagnostic tests appropriately considered even if not ultimately  performed: ***N/Casey    Discussion of test interpretation with external physician/provider :  ***N/Casey    Discussion of management with other providers: Consulted with  ***N/Casey. Patient condition and all pertinent labs and/or radiology studies discussed with physician.     Risk of Complications and/or Morbidity or Mortality of Patient Management       {Risk:58851}             Diagnosis:  Final diagnoses:   None       Disposition:  ED Disposition       None            Follow-Up Providers (if applicable)    No follow-up provider specified.     New Prescriptions    No medications on file       Prescriptions:  Patient's Medications   New Prescriptions    No medications on file   Previous Medications    ALBUTEROL SULFATE HFA (PROVENTIL) 108 (90 BASE) MCG/ACT INHALER    continuous prn    BLOOD GLUCOSE MONITORING SUPPL (ONETOUCH VERIO FLEX SYSTEM) W/DEVICE KIT        DICLOFENAC SODIUM (VOLTAREN) 1 % GEL TOPICAL GEL    Apply 4 g topically 4 (four) times daily    FLUTICASONE-SALMETEROL (ADVAIR DISKUS) 250-50 MCG/ACT AEROSOL PWDR, BREATH ACTIVATED    Inhale 1 puff into the lungs every 12 (twelve) hours    LISINOPRIL-HYDROCHLOROTHIAZIDE (ZESTORETIC) 20-25 MG PER TABLET    Take 1 tablet by mouth daily    METFORMIN (GLUCOPHAGE-XR) 500 MG 24 HR TABLET    Take 1 tablet (500 mg) by mouth every morning with breakfast    NEBULIZER MISC  Dispense one nebulizer machine to include mask and tubing    Sig:  Use for breathing trouble, wheezing Q 4-6 hrs.    ROSUVASTATIN (CRESTOR) 10 MG TABLET    Take 1 tablet (10 mg) by mouth daily    TADALAFIL (CIALIS) 20 MG TABLET    Take 1 tablet (20 mg) by mouth daily as needed for Erectile Dysfunction   Modified Medications    No medications on file   Discontinued Medications    No medications on file         EMERGENCY DEPT. MEDICATIONS      ED Medication Orders (From admission, onward)      None                 *This note was generated by the Epic EMR system/ Dragon speech recognition and may contain  inherent errors or omissions not intended by the user. Grammatical errors, random word insertions, deletions, pronoun errors and incomplete sentences are occasional consequences of this technology due to software limitations. Not all errors are caught or corrected. If there are questions or concerns about the content of this note or information contained within the body of this dictation they should be addressed directly with the author for clarification.    *My documentation is often completed after the patient is no longer under my clinical care. In some cases, the Epic EMR may pull updated results into the above documentation which may not reflect all results or information that was available to me at the time of my medical decision making.

## 2022-08-05 NOTE — ED Triage Notes (Signed)
Pt POV with SOB, [facility].  Pt c/o denies pain or distress ambulated and able to speak in full sentences O2 saturations att 96% on arrival.

## 2022-08-05 NOTE — Discharge Instructions (Signed)
Return to ED for nausea, vomiting, fevers, chills, chest pain, shortness of breath, abdominal pain, headaches, neck pain, back pain, weakness, numbness, worsening pain, worsening symptoms, unable to follow up with PMD/Specialist as directed

## 2022-08-13 ENCOUNTER — Other Ambulatory Visit (INDEPENDENT_AMBULATORY_CARE_PROVIDER_SITE_OTHER): Payer: Self-pay | Admitting: Family Medicine

## 2022-08-13 DIAGNOSIS — E7849 Other hyperlipidemia: Secondary | ICD-10-CM

## 2022-08-14 NOTE — Telephone Encounter (Signed)
Last o/v July 2023.  Patient does not have an upcoming appointment    Last filled October 2023.   Queued up 30 with 0 refills.    Last Primary Care Labs:   Lab Results   Component Value Date    WBC 6.95 11/04/2021    HGB 15.0 11/04/2021    HCT 47.3 11/04/2021    PLT 258 11/04/2021    CHOL 159 11/04/2021    TRIG 100 11/04/2021    HDL 45 11/04/2021    LDL 94 11/04/2021    ALT 29 11/04/2021    AST 24 11/04/2021    NA 142 11/04/2021    K 4.7 11/04/2021    CL 106 11/04/2021    CREAT 0.8 11/04/2021    BUN 15.0 11/04/2021    CO2 25 11/04/2021    TSH 0.94 11/04/2021    PSA 0.801 11/04/2021    GLU 89 11/04/2021    HGBA1C 7.0 (A) 04/24/2022

## 2022-09-13 ENCOUNTER — Other Ambulatory Visit (INDEPENDENT_AMBULATORY_CARE_PROVIDER_SITE_OTHER): Payer: Self-pay | Admitting: Family Medicine

## 2022-09-13 DIAGNOSIS — E7849 Other hyperlipidemia: Secondary | ICD-10-CM

## 2022-09-13 NOTE — Telephone Encounter (Signed)
Last o/v July 2023.  Patient does not have an upcoming appointment and notified appointment needed.    Last filled October 2023.   Queued up 30 with 0 refills.    Last Primary Care Labs:   Lab Results   Component Value Date    WBC 6.95 11/04/2021    HGB 15.0 11/04/2021    HCT 47.3 11/04/2021    PLT 258 11/04/2021    CHOL 159 11/04/2021    TRIG 100 11/04/2021    HDL 45 11/04/2021    LDL 94 11/04/2021    ALT 29 11/04/2021    AST 24 11/04/2021    NA 142 11/04/2021    K 4.7 11/04/2021    CL 106 11/04/2021    CREAT 0.8 11/04/2021    BUN 15.0 11/04/2021    CO2 25 11/04/2021    TSH 0.94 11/04/2021    PSA 0.801 11/04/2021    GLU 89 11/04/2021    HGBA1C 7.0 (A) 04/24/2022

## 2022-09-25 ENCOUNTER — Other Ambulatory Visit (INDEPENDENT_AMBULATORY_CARE_PROVIDER_SITE_OTHER): Payer: Self-pay | Admitting: Family Medicine

## 2022-09-25 DIAGNOSIS — N529 Male erectile dysfunction, unspecified: Secondary | ICD-10-CM

## 2022-09-25 DIAGNOSIS — I10 Essential (primary) hypertension: Secondary | ICD-10-CM

## 2022-09-25 MED ORDER — TADALAFIL 20 MG PO TABS
20.0000 mg | ORAL_TABLET | Freq: Every day | ORAL | 0 refills | Status: DC | PRN
Start: 2022-09-25 — End: 2022-12-01

## 2022-09-25 MED ORDER — LISINOPRIL-HYDROCHLOROTHIAZIDE 20-25 MG PO TABS
1.0000 | ORAL_TABLET | Freq: Every day | ORAL | 0 refills | Status: DC
Start: 2022-09-25 — End: 2022-10-23

## 2022-09-25 NOTE — Telephone Encounter (Signed)
Last o/v July 2023.  Patient does not have an upcoming appointment and notified appointment needed. Pt previously reported having difficulty with insurance coverage. Inofrmation for [facility] provided.     Last filled October 2023.   Queued up  30  with 0 refills.     Last Primary Care Labs:         Lab Results   Component Value Date     WBC 6.95 11/04/2021     HGB 15.0 11/04/2021     HCT 47.3 11/04/2021     PLT 258 11/04/2021     CHOL 159 11/04/2021     TRIG 100 11/04/2021     HDL 45 11/04/2021     LDL 94 11/04/2021     ALT 29 11/04/2021     AST 24 11/04/2021     NA 142 11/04/2021     K 4.7 11/04/2021     CL 106 11/04/2021     CREAT 0.8 11/04/2021     BUN 15.0 11/04/2021     CO2 25 11/04/2021     TSH 0.94 11/04/2021     PSA 0.801 11/04/2021     GLU 89 11/04/2021     HGBA1C 7.0 (A) 04/24/2022

## 2022-09-25 NOTE — Telephone Encounter (Signed)
Last o/v July 2023.  Patient does not have an upcoming appointment and notified appointment needed. Pt previously reported having difficulty with insurance coverage. Inofrmation for [facility] provided.    Last filled October 2023.   Queued up  10  with 0 refills.    Last Primary Care Labs:   Lab Results   Component Value Date    WBC 6.95 11/04/2021    HGB 15.0 11/04/2021    HCT 47.3 11/04/2021    PLT 258 11/04/2021    CHOL 159 11/04/2021    TRIG 100 11/04/2021    HDL 45 11/04/2021    LDL 94 11/04/2021    ALT 29 11/04/2021    AST 24 11/04/2021    NA 142 11/04/2021    K 4.7 11/04/2021    CL 106 11/04/2021    CREAT 0.8 11/04/2021    BUN 15.0 11/04/2021    CO2 25 11/04/2021    TSH 0.94 11/04/2021    PSA 0.801 11/04/2021    GLU 89 11/04/2021    HGBA1C 7.0 (A) 04/24/2022

## 2022-10-22 ENCOUNTER — Other Ambulatory Visit (INDEPENDENT_AMBULATORY_CARE_PROVIDER_SITE_OTHER): Payer: Self-pay | Admitting: Family Medicine

## 2022-10-22 DIAGNOSIS — E7849 Other hyperlipidemia: Secondary | ICD-10-CM

## 2022-10-23 ENCOUNTER — Ambulatory Visit (INDEPENDENT_AMBULATORY_CARE_PROVIDER_SITE_OTHER): Payer: No Typology Code available for payment source | Admitting: Family Medicine

## 2022-10-23 ENCOUNTER — Encounter (INDEPENDENT_AMBULATORY_CARE_PROVIDER_SITE_OTHER): Payer: Self-pay | Admitting: Family Medicine

## 2022-10-23 VITALS — BP 132/88 | HR 86 | Temp 97.3°F | Wt 390.8 lb

## 2022-10-23 DIAGNOSIS — J4540 Moderate persistent asthma, uncomplicated: Secondary | ICD-10-CM

## 2022-10-23 DIAGNOSIS — E7849 Other hyperlipidemia: Secondary | ICD-10-CM

## 2022-10-23 DIAGNOSIS — Z23 Encounter for immunization: Secondary | ICD-10-CM

## 2022-10-23 DIAGNOSIS — I10 Essential (primary) hypertension: Secondary | ICD-10-CM

## 2022-10-23 DIAGNOSIS — Z1159 Encounter for screening for other viral diseases: Secondary | ICD-10-CM

## 2022-10-23 DIAGNOSIS — E119 Type 2 diabetes mellitus without complications: Secondary | ICD-10-CM

## 2022-10-23 LAB — HEMOLYSIS INDEX: Hemolysis Index: 8 Index (ref 0–24)

## 2022-10-23 LAB — MICROALBUMIN, RANDOM URINE
Urine Creatinine, Random: 129.6 mg/dL
Urine Microalbumin, Random: 21 ug/ml (ref 0.0–30.0)
Urine Microalbumin/Creatinine Ratio: 16 ug/mg (ref 0–30)

## 2022-10-23 LAB — COMPREHENSIVE METABOLIC PANEL
ALT: 34 U/L (ref 0–55)
AST (SGOT): 23 U/L (ref 5–41)
Albumin/Globulin Ratio: 1.2 (ref 0.9–2.2)
Albumin: 4.2 g/dL (ref 3.5–5.0)
Alkaline Phosphatase: 43 U/L (ref 37–117)
Anion Gap: 9 (ref 5.0–15.0)
BUN: 12 mg/dL (ref 9.0–28.0)
Bilirubin, Total: 0.6 mg/dL (ref 0.2–1.2)
CO2: 26 mEq/L (ref 17–29)
Calcium: 9.8 mg/dL (ref 8.5–10.5)
Chloride: 102 mEq/L (ref 99–111)
Creatinine: 0.9 mg/dL (ref 0.5–1.5)
Globulin: 3.4 g/dL (ref 2.0–3.6)
Glucose: 136 mg/dL — ABNORMAL HIGH (ref 70–100)
Potassium: 4.7 mEq/L (ref 3.5–5.3)
Protein, Total: 7.6 g/dL (ref 6.0–8.3)
Sodium: 137 mEq/L (ref 135–145)
eGFR: >60 mL/min/{1.73_m2} (ref >=60)

## 2022-10-23 LAB — LIPID PANEL
Cholesterol / HDL Ratio: 3.9 Index
Cholesterol: 183 mg/dL (ref 0–199)
HDL: 47 mg/dL (ref 40–9999)
LDL Calculated: 119 mg/dL — ABNORMAL HIGH (ref 0–99)
Triglycerides: 83 mg/dL (ref 34–149)
VLDL Calculated: 17 mg/dL (ref 10–40)

## 2022-10-23 LAB — HEMOGLOBIN A1C
Average Estimated Glucose: 157.1 mg/dL
Hemoglobin A1C: 7.1 % — ABNORMAL HIGH (ref 4.6–5.6)

## 2022-10-23 LAB — HEPATITIS C ANTIBODY: Hepatitis C, AB: NONREACTIVE

## 2022-10-23 MED ORDER — LISINOPRIL-HYDROCHLOROTHIAZIDE 20-25 MG PO TABS
1.0000 | ORAL_TABLET | Freq: Every day | ORAL | 1 refills | Status: DC
Start: 2022-10-23 — End: 2023-04-22

## 2022-10-23 MED ORDER — FLUTICASONE-SALMETEROL 500-50 MCG/ACT IN AEPB
1.0000 | INHALATION_SPRAY | Freq: Two times a day (BID) | RESPIRATORY_TRACT | 5 refills | Status: AC
Start: 2022-10-23 — End: ?

## 2022-10-23 MED ORDER — MONTELUKAST SODIUM 10 MG PO TABS
10.0000 mg | ORAL_TABLET | Freq: Every evening | ORAL | 2 refills | Status: DC
Start: 2022-10-23 — End: 2023-01-24

## 2022-10-23 NOTE — Progress Notes (Unsigned)
[facility] [place]                       Date of Exam: 10/23/2022 9:32 AM        Patient ID: Andrew Casey is a 52 y.o. male.  Attending Physician: Marquette Saa, DO        Chief Complaint:    Chief Complaint   Patient presents with    Diabetes Follow-up    Hypertension     Follow up           Assessment:    1. Other hyperlipidemia  - Lipid panel    2. Primary hypertension  - Comprehensive metabolic panel  - lisinopril-hydroCHLOROthiazide (Zestoretic) 20-25 MG per tablet; Take 1 tablet by mouth daily  Dispense: 90 tablet; Refill: 1    3. Type 2 diabetes mellitus without complication, without long-term current use of insulin  - Hemoglobin A1C  - Urine Microalbumin Random    4. Morbid obesity    5. Moderate persistent asthma, unspecified whether complicated  - fluticasone-salmeterol (ADVAIR DISKUS) 500-50 MCG/ACT Aerosol Pwdr, Breath Activated; Inhale 1 puff into the lungs every 12 (twelve) hours  Dispense: 1 each; Refill: 5  - montelukast (Singulair) 10 MG tablet; Take 1 tablet (10 mg) by mouth nightly  Dispense: 30 tablet; Refill: 2    6. Need for hepatitis C screening test  - Hepatitis C (HCV) antibody, Total    7. Need for vaccination  - Flu vaccine QUADRIVALENT (PF) 6 months and older (FLULAVAL/FLUARIX)            Plan:    Patient may continue to hold metformin and will await results of his hemoglobin A1c to decide if other medications are necessary.  Otherwise continue on his current antihypertensives and cholesterol-lowering medication.  Regarding his asthma I have increased his Advair to 500-50 mcg 1 puff twice daily.  I have also added montelukast 10 mg once daily.  Asked to notify me in the next 2 to 3 weeks of his breathing status.  Influenza vaccine administered today as well.    Obesity:  Body mass index is 56.07 kg/m.  - Obesity Class III (BMI 40 or greater)  - Pt's weight has remained stable compared to most recent documented weights.  - Counseled on exercise including  obtaining at least 150 minutes of aerobic exercise per week and eating a healthy well balanced diet.          Follow-up:    Return in about 3 months (around 01/22/2023) for Diabetes, asthma.             HPI:    Diabetes Follow-up    Hypertension      Patient is a 52 year old male who presents for follow-up of type 2 diabetes mellitus, hypertension, hyperlipidemia, asthma and morbid obesity.  Patient has opted to stop metformin due to GI side effects.  He describes diarrhea since starting the medication but since stopping it his bowel habits have returned to normal.  He does periodically monitor his blood sugars and states that they are generally in the 120 range fasting.  His biggest concern today is his breathing.  For the past several months he has noted increase chest congestion and wheezing.  He was seen in the urgent care in October for similar symptoms and treated with antibiotics and steroids.  He has been out of Advair for the past week and has been using albuterol inhalers up to 5  times per day.  Patient continues to make adjustment in his diet and has lost a few pounds since his last visit.  Blood pressures are generally well-controlled at home.  Monitors them approximately once per week.    {chronic conditions (Optional):50350}           Problem List:    Patient Active Problem List   Diagnosis    Moderate persistent asthma, unspecified whether complicated    Type 2 diabetes mellitus without complication, without long-term current use of insulin    Other hyperlipidemia    Primary hypertension    Morbid obesity    Body mass index 50.0-59.9, adult    Cervical disc herniation    Chronic pain of right knee    OSA (obstructive sleep apnea)    Nocturnal hypoxemia             Current Meds:    Outpatient Medications Marked as Taking for the 10/23/22 encounter (Office Visit) with Royanne Foots, DO   Medication Sig Dispense Refill    albuterol sulfate HFA (PROVENTIL) 108 (90 Base) MCG/ACT inhaler continuous prn       albuterol sulfate HFA (PROVENTIL) 108 (90 Base) MCG/ACT inhaler Inhale 2 puffs into the lungs every 4 (four) hours as needed for Wheezing or Shortness of Breath (coughing) Dispense with spacer 1 each 0    Blood Glucose Monitoring Suppl (onetouch verio flex system) w/Device kit       Nebulizer Misc Dispense one nebulizer machine to include mask and tubing    Sig:  Use for breathing trouble, wheezing Q 4-6 hrs. 1 each 0    rosuvastatin (CRESTOR) 10 MG tablet TAKE 1 TABLET BY MOUTH EVERY DAY 90 tablet 1    tadalafil (CIALIS) 20 MG tablet Take 1 tablet (20 mg) by mouth daily as needed for Erectile Dysfunction 10 tablet 0    [DISCONTINUED] lisinopril-hydroCHLOROthiazide (Zestoretic) 20-25 MG per tablet Take 1 tablet by mouth daily 30 tablet 0          Allergies:    Allergies   Allergen Reactions    Motrin [Ibuprofen]      Makes asthma worse, tolerates asa              Past Surgical History:    Past Surgical History:   Procedure Laterality Date    APPENDECTOMY (OPEN)      SINUS SURGERY                 The following sections were reviewed this encounter by the provider:   Tobacco  Allergies  Meds  Problems  Med Hx  Surg Hx  Fam Hx               ROS:    Review of Systems   Constitutional:  Negative for chills and fever.   Respiratory:  Positive for cough, shortness of breath and wheezing.    Cardiovascular:  Negative for chest pain and palpitations.   Gastrointestinal:  Negative for abdominal pain and nausea.   Endocrine: Negative for polydipsia and polyuria.   Musculoskeletal:  Negative for arthralgias and joint swelling.   Skin:  Negative for rash.   Neurological:  Negative for dizziness and headaches.            Vital Signs:    BP 132/88 (BP Site: Left arm, Patient Position: Sitting, Cuff Size: Large)   Pulse 86   Temp 97.3 F (36.3 C) (Temporal)   Wt (!) 177.3 kg (390 lb 12.8  oz)   SpO2 95%   BMI 56.07 kg/m        Physical Exam:    Physical Exam  Constitutional:       General: He is not in acute  distress.     Appearance: Normal appearance.   Cardiovascular:      Rate and Rhythm: Normal rate and regular rhythm.      Pulses: Normal pulses.      Heart sounds: No murmur heard.  Pulmonary:      Effort: Pulmonary effort is normal. No respiratory distress.      Breath sounds: Wheezing and rhonchi present.   Musculoskeletal:         General: No swelling. Normal range of motion.      Cervical back: Normal range of motion and neck supple. No tenderness.   Neurological:      General: No focal deficit present.      Mental Status: He is alert and oriented to person, place, and time.                Marquette Saa, DO

## 2022-10-23 NOTE — Progress Notes (Unsigned)
Have you seen any specialists since your last visit with [place]?  Yes ED      The patient was informed that the following HM items are still outstanding:   Health Maintenance Due   Topic Date Due    DM OPHTHALMOLOGY EXAM  Never done    DEPRESSION SCREENING  Never done    HEPATITIS C SCREENING  Never done    HIGH RISK PNEUMONIA  Never done    Tetanus Ten-Year  05/18/2016    INFLUENZA VACCINE  05/16/2022    COVID-19 Vaccine (4 - 2023-24 season) 06/16/2022     BS at home is monitored, last checked last week Friday, AM fasting 120s.  Not taking metformin for some months now    BP at home is monitored about 1q week. It is usually low but pt does not remember the ranges  Recently ran out of advair diskus. Does not work as good as it used to per pt

## 2022-12-01 ENCOUNTER — Other Ambulatory Visit (INDEPENDENT_AMBULATORY_CARE_PROVIDER_SITE_OTHER): Payer: Self-pay | Admitting: Family Medicine

## 2022-12-01 DIAGNOSIS — N529 Male erectile dysfunction, unspecified: Secondary | ICD-10-CM

## 2022-12-01 MED ORDER — TADALAFIL 20 MG PO TABS
20.0000 mg | ORAL_TABLET | Freq: Every day | ORAL | 0 refills | Status: DC | PRN
Start: 2022-12-01 — End: 2023-01-28

## 2022-12-01 NOTE — Telephone Encounter (Signed)
Last o/v January 2024.  Patient does not have an upcoming appointment; recommended follow-up of 01/2023    Last filled December 2023.   Queued up  10  with 0 refills.    Last Primary Care Labs:   Lab Results   Component Value Date    WBC 6.95 11/04/2021    HGB 15.0 11/04/2021    HCT 47.3 11/04/2021    PLT 258 11/04/2021    CHOL 183 10/23/2022    TRIG 83 10/23/2022    HDL 47 10/23/2022    LDL 119 (H) 10/23/2022    ALT 34 10/23/2022    AST 23 10/23/2022    NA 137 10/23/2022    K 4.7 10/23/2022    CL 102 10/23/2022    CREAT 0.9 10/23/2022    BUN 12.0 10/23/2022    CO2 26 10/23/2022    TSH 0.94 11/04/2021    PSA 0.801 11/04/2021    GLU 136 (H) 10/23/2022    HGBA1C 7.1 (H) 10/23/2022

## 2022-12-05 ENCOUNTER — Encounter (INDEPENDENT_AMBULATORY_CARE_PROVIDER_SITE_OTHER): Payer: Self-pay | Admitting: Family Medicine

## 2023-01-24 ENCOUNTER — Other Ambulatory Visit (INDEPENDENT_AMBULATORY_CARE_PROVIDER_SITE_OTHER): Payer: Self-pay | Admitting: Family Medicine

## 2023-01-24 DIAGNOSIS — J4540 Moderate persistent asthma, uncomplicated: Secondary | ICD-10-CM

## 2023-01-24 NOTE — Telephone Encounter (Signed)
Called patient, no answer. Per disclosure ok to leave detailed message. Patient made aware medication was refilled and ready for pick-up.  Left call back number for questions.

## 2023-01-28 ENCOUNTER — Encounter (INDEPENDENT_AMBULATORY_CARE_PROVIDER_SITE_OTHER): Payer: Self-pay | Admitting: Family Medicine

## 2023-01-28 ENCOUNTER — Other Ambulatory Visit (INDEPENDENT_AMBULATORY_CARE_PROVIDER_SITE_OTHER): Payer: Self-pay | Admitting: Family Medicine

## 2023-01-28 DIAGNOSIS — J4540 Moderate persistent asthma, uncomplicated: Secondary | ICD-10-CM

## 2023-01-28 DIAGNOSIS — N529 Male erectile dysfunction, unspecified: Secondary | ICD-10-CM

## 2023-01-29 MED ORDER — TADALAFIL 20 MG PO TABS
20.0000 mg | ORAL_TABLET | Freq: Every day | ORAL | 1 refills | Status: DC | PRN
Start: 2023-01-29 — End: 2023-04-04

## 2023-01-29 NOTE — Telephone Encounter (Signed)
Last o/v January 2024.  Patient does not have an upcoming appointment and notified appointment needed.    Last filled February 2024.   Queued up  10  with 1 refills.    Last Primary Care Labs:   Lab Results   Component Value Date    WBC 6.95 11/04/2021    HGB 15.0 11/04/2021    HCT 47.3 11/04/2021    PLT 258 11/04/2021    CHOL 183 10/23/2022    TRIG 83 10/23/2022    HDL 47 10/23/2022    LDL 119 (H) 10/23/2022    ALT 34 10/23/2022    AST 23 10/23/2022    NA 137 10/23/2022    K 4.7 10/23/2022    CL 102 10/23/2022    CREAT 0.9 10/23/2022    BUN 12.0 10/23/2022    CO2 26 10/23/2022    TSH 0.94 11/04/2021    PSA 0.801 11/04/2021    GLU 136 (H) 10/23/2022    HGBA1C 7.1 (H) 10/23/2022

## 2023-02-02 MED ORDER — ALBUTEROL SULFATE HFA 108 (90 BASE) MCG/ACT IN AERS
2.0000 | INHALATION_SPRAY | RESPIRATORY_TRACT | 0 refills | Status: DC | PRN
Start: 2023-02-02 — End: 2023-05-06

## 2023-02-20 ENCOUNTER — Ambulatory Visit (INDEPENDENT_AMBULATORY_CARE_PROVIDER_SITE_OTHER): Payer: No Typology Code available for payment source | Admitting: Family Medicine

## 2023-02-27 ENCOUNTER — Ambulatory Visit (INDEPENDENT_AMBULATORY_CARE_PROVIDER_SITE_OTHER): Payer: No Typology Code available for payment source | Admitting: Family Medicine

## 2023-03-02 ENCOUNTER — Ambulatory Visit (INDEPENDENT_AMBULATORY_CARE_PROVIDER_SITE_OTHER): Payer: No Typology Code available for payment source | Admitting: Family Medicine

## 2023-03-07 ENCOUNTER — Ambulatory Visit (INDEPENDENT_AMBULATORY_CARE_PROVIDER_SITE_OTHER): Payer: No Typology Code available for payment source | Admitting: Nurse Practitioner

## 2023-03-07 ENCOUNTER — Encounter (INDEPENDENT_AMBULATORY_CARE_PROVIDER_SITE_OTHER): Payer: Self-pay | Admitting: Nurse Practitioner

## 2023-03-07 VITALS — BP 121/79 | HR 93 | Temp 98.0°F | Resp 20

## 2023-03-07 DIAGNOSIS — J4541 Moderate persistent asthma with (acute) exacerbation: Secondary | ICD-10-CM

## 2023-03-07 MED ORDER — ALBUTEROL SULFATE (2.5 MG/3ML) 0.083% IN NEBU
2.5000 mg | INHALATION_SOLUTION | Freq: Four times a day (QID) | RESPIRATORY_TRACT | 2 refills | Status: AC | PRN
Start: 2023-03-07 — End: ?

## 2023-03-07 MED ORDER — IPRATROPIUM BROMIDE 0.02 % IN SOLN
0.5000 mg | Freq: Once | RESPIRATORY_TRACT | Status: AC
Start: 2023-03-07 — End: 2023-03-07
  Administered 2023-03-07: 0.5 mg via RESPIRATORY_TRACT

## 2023-03-07 MED ORDER — ALBUTEROL SULFATE (2.5 MG/3ML) 0.083% IN NEBU
5.0000 mg | INHALATION_SOLUTION | Freq: Once | RESPIRATORY_TRACT | Status: AC
Start: 2023-03-07 — End: 2023-03-07
  Administered 2023-03-07: 5 mg via RESPIRATORY_TRACT

## 2023-03-07 MED ORDER — AZITHROMYCIN 250 MG PO TABS
ORAL_TABLET | ORAL | 0 refills | Status: AC
Start: 2023-03-07 — End: 2023-03-13

## 2023-03-07 MED ORDER — PREDNISONE 20 MG PO TABS
ORAL_TABLET | ORAL | 0 refills | Status: DC
Start: 2023-03-07 — End: 2023-07-09

## 2023-03-07 NOTE — Progress Notes (Signed)
[facility]ICE VISIT           Andrew Casey  is a 52 y.o.  male.  Patient is using albuteral 4x per day and his nebulizer treatment at night for a few days.     Asthma  He complains of chest tightness, cough, difficulty breathing, shortness of breath and wheezing. There is no hoarse voice. This is a chronic problem. The current episode started 1 to 4 weeks ago. The problem occurs constantly. The problem has been gradually worsening. The cough is productive of sputum. Associated symptoms include malaise/fatigue. Pertinent negatives include no appetite change, chest pain or fever. His past medical history is significant for asthma.       Review of Systems   Constitutional:  Positive for malaise/fatigue. Negative for activity change, appetite change, chills, diaphoresis, fatigue, fever and unexpected weight change.   HENT:  Negative for hoarse voice.    Respiratory:  Positive for cough, chest tightness, shortness of breath and wheezing. Negative for apnea, choking and stridor.    Cardiovascular:  Negative for chest pain, palpitations and leg swelling.   Gastrointestinal:  Negative for constipation, diarrhea, nausea and vomiting.   Skin:  Negative for color change and pallor.   Allergic/Immunologic: Negative for immunocompromised state.   All other systems reviewed and are negative.       BP 121/79   Pulse 93   Temp 98 F (36.7 C) (Oral)   Resp 20   SpO2 96%    Physical Exam  Vitals and nursing note reviewed.   Constitutional:       General: He is not in acute distress.     Appearance: Normal appearance. He is well-developed and well-groomed. He is not ill-appearing, toxic-appearing or diaphoretic.   Neck:      Vascular: No carotid bruit or JVD.   Cardiovascular:      Rate and Rhythm: Normal rate and regular rhythm. No extrasystoles are present.     Pulses: Normal pulses. No decreased pulses.      Heart sounds: Normal heart sounds, S1 normal and S2 normal.   Pulmonary:      Effort: Pulmonary effort  is normal.      Breath sounds: Normal air entry. No stridor, decreased air movement or transmitted upper airway sounds. Examination of the right-upper field reveals wheezing. Examination of the left-upper field reveals wheezing. Examination of the right-middle field reveals wheezing. Examination of the left-middle field reveals wheezing. Examination of the right-lower field reveals wheezing. Examination of the left-lower field reveals wheezing. Wheezing present. No decreased breath sounds, rhonchi or rales.   Musculoskeletal:      Cervical back: Full passive range of motion without pain, normal range of motion and neck supple.      Right lower leg: No edema.      Left lower leg: No edema.   Skin:     General: Skin is warm and dry.      Capillary Refill: Capillary refill takes less than 2 seconds.   Neurological:      Mental Status: He is alert and oriented to person, place, and time.   Psychiatric:         Behavior: Behavior is cooperative.          1. Moderate persistent asthma with acute exacerbation  - albuterol (PROVENTIL) (2.5 MG/3ML) 0.083% nebulizer solution 5 mg  - ipratropium (ATROVENT) 0.02 % nebulizer solution 0.5 mg  - predniSONE (DELTASONE) 20 MG tablet; Take 3 tabs daily  x 3d, 2 tabs daily x 3d, 1 tab daily x 3d, 1/2 tab daily x 3d, then off  Dispense: 21 tablet; Refill: 0  - albuterol (PROVENTIL) (2.5 MG/3ML) 0.083% nebulizer solution; Take 3 mLs (2.5 mg) by nebulization every 6 (six) hours as needed for Wheezing  Dispense: 75 mL; Refill: 2  - azithromycin (ZITHROMAX) 250 MG tablet; Take 2 tablets (500 mg) on  Day 1,  followed by 1 tablet (250 mg) once daily on Days 2 through 5.  Dispense: 6 tablet; Refill: 0    Status- chronic, exacerbated      Nebs in office Improvement of WOB. Sat improved to 96%  Will cover with nebs, steroid, azithromycin at home.  Follow up for new, persistent, or worsening symptoms.    Return for Follow-up with PCP Rudell Cobb- physical.

## 2023-03-09 ENCOUNTER — Ambulatory Visit (INDEPENDENT_AMBULATORY_CARE_PROVIDER_SITE_OTHER): Payer: No Typology Code available for payment source | Admitting: Family Medicine

## 2023-04-04 ENCOUNTER — Other Ambulatory Visit (INDEPENDENT_AMBULATORY_CARE_PROVIDER_SITE_OTHER): Payer: Self-pay | Admitting: Family Medicine

## 2023-04-04 DIAGNOSIS — N529 Male erectile dysfunction, unspecified: Secondary | ICD-10-CM

## 2023-04-04 NOTE — Telephone Encounter (Signed)
Last o/v January 2024.  Patient does not have an upcoming appointment and notified appointment needed.    Last filled April 2024.   Queued up  10  with 0 refills.    Last Primary Care Labs:   Lab Results   Component Value Date    WBC 6.95 11/04/2021    HGB 15.0 11/04/2021    HCT 47.3 11/04/2021    PLT 258 11/04/2021    CHOL 183 10/23/2022    TRIG 83 10/23/2022    HDL 47 10/23/2022    LDL 119 (H) 10/23/2022    ALT 34 10/23/2022    AST 23 10/23/2022    NA 137 10/23/2022    K 4.7 10/23/2022    CL 102 10/23/2022    CREAT 0.9 10/23/2022    BUN 12.0 10/23/2022    CO2 26 10/23/2022    TSH 0.94 11/04/2021    PSA 0.801 11/04/2021    GLU 136 (H) 10/23/2022    HGBA1C 7.1 (H) 10/23/2022

## 2023-04-19 ENCOUNTER — Other Ambulatory Visit (INDEPENDENT_AMBULATORY_CARE_PROVIDER_SITE_OTHER): Payer: Self-pay | Admitting: Family Medicine

## 2023-04-19 DIAGNOSIS — E7849 Other hyperlipidemia: Secondary | ICD-10-CM

## 2023-04-19 DIAGNOSIS — I10 Essential (primary) hypertension: Secondary | ICD-10-CM

## 2023-05-06 ENCOUNTER — Other Ambulatory Visit (INDEPENDENT_AMBULATORY_CARE_PROVIDER_SITE_OTHER): Payer: Self-pay | Admitting: Family Medicine

## 2023-05-06 DIAGNOSIS — J4540 Moderate persistent asthma, uncomplicated: Secondary | ICD-10-CM

## 2023-05-07 MED ORDER — ALBUTEROL SULFATE HFA 108 (90 BASE) MCG/ACT IN AERS
2.0000 | INHALATION_SPRAY | RESPIRATORY_TRACT | 0 refills | Status: AC | PRN
Start: 2023-05-07 — End: 2023-08-07

## 2023-05-07 NOTE — Telephone Encounter (Signed)
Last o/v January 2024.  Patient does not have an upcoming appointment and notified appointment needed.    No further refills until pt sees prescriber    Last filled April 2024.   Queued up  1  with 0 refills.    Last Primary Care Labs:   Lab Results   Component Value Date    WBC 6.95 11/04/2021    HGB 15.0 11/04/2021    HCT 47.3 11/04/2021    PLT 258 11/04/2021    CHOL 183 10/23/2022    TRIG 83 10/23/2022    HDL 47 10/23/2022    LDL 119 (H) 10/23/2022    ALT 34 10/23/2022    AST 23 10/23/2022    NA 137 10/23/2022    K 4.7 10/23/2022    CL 102 10/23/2022    CREAT 0.9 10/23/2022    BUN 12.0 10/23/2022    CO2 26 10/23/2022    TSH 0.94 11/04/2021    PSA 0.801 11/04/2021    GLU 136 (H) 10/23/2022    HGBA1C 7.1 (H) 10/23/2022

## 2023-06-18 ENCOUNTER — Other Ambulatory Visit (INDEPENDENT_AMBULATORY_CARE_PROVIDER_SITE_OTHER): Payer: Self-pay | Admitting: Family Medicine

## 2023-06-18 DIAGNOSIS — I10 Essential (primary) hypertension: Secondary | ICD-10-CM

## 2023-06-18 DIAGNOSIS — N529 Male erectile dysfunction, unspecified: Secondary | ICD-10-CM

## 2023-06-18 DIAGNOSIS — E7849 Other hyperlipidemia: Secondary | ICD-10-CM

## 2023-06-20 ENCOUNTER — Encounter (INDEPENDENT_AMBULATORY_CARE_PROVIDER_SITE_OTHER): Payer: Self-pay | Admitting: Family Medicine

## 2023-06-20 DIAGNOSIS — N529 Male erectile dysfunction, unspecified: Secondary | ICD-10-CM

## 2023-06-20 MED ORDER — TADALAFIL 20 MG PO TABS
20.0000 mg | ORAL_TABLET | Freq: Every day | ORAL | 0 refills | Status: DC | PRN
Start: 2023-06-20 — End: 2023-11-05

## 2023-07-09 ENCOUNTER — Ambulatory Visit (INDEPENDENT_AMBULATORY_CARE_PROVIDER_SITE_OTHER): Payer: No Typology Code available for payment source | Admitting: Nurse Practitioner

## 2023-07-09 ENCOUNTER — Encounter (INDEPENDENT_AMBULATORY_CARE_PROVIDER_SITE_OTHER): Payer: Self-pay | Admitting: Nurse Practitioner

## 2023-07-09 VITALS — BP 145/90 | HR 77 | Temp 98.2°F | Resp 16

## 2023-07-09 DIAGNOSIS — Z23 Encounter for immunization: Secondary | ICD-10-CM

## 2023-07-09 DIAGNOSIS — I10 Essential (primary) hypertension: Secondary | ICD-10-CM

## 2023-07-09 DIAGNOSIS — E7849 Other hyperlipidemia: Secondary | ICD-10-CM

## 2023-07-09 MED ORDER — ROSUVASTATIN CALCIUM 10 MG PO TABS
10.0000 mg | ORAL_TABLET | Freq: Every day | ORAL | 1 refills | Status: DC
Start: 2023-07-09 — End: 2024-02-04

## 2023-07-09 MED ORDER — LISINOPRIL-HYDROCHLOROTHIAZIDE 20-25 MG PO TABS
1.0000 | ORAL_TABLET | Freq: Every day | ORAL | 1 refills | Status: AC
Start: 2023-07-09 — End: ?

## 2023-07-09 NOTE — Progress Notes (Signed)
PRIMARY CARE   OFFICE VISIT            HPI   Chief Complaint   Patient presents with    Medication Refill     lisinopril-hydroCHLOROthiazide and Crestor       Without lisinopril-hydroCHLOROthiazide for one week. Asymptomatic. Does not check BP at home. Has appointment for well check in 4 weeks with PCP  Would also like refill of cholesterol medication.          ROS   Review of Systems   Constitutional:  Negative for activity change, appetite change, chills, diaphoresis, fatigue, fever and unexpected weight change.   Respiratory:  Negative for apnea, cough, choking, chest tightness, shortness of breath, wheezing and stridor.    Cardiovascular:  Negative for chest pain, palpitations and leg swelling.   Gastrointestinal:  Negative for constipation, diarrhea, nausea and vomiting.   Skin:  Negative for color change and pallor.   Allergic/Immunologic: Negative for immunocompromised state.   All other systems reviewed and are negative.        Vital Signs   BP 145/90 (BP Site: Right arm, Patient Position: Sitting, Cuff Size: Large)   Pulse 77   Temp 98.2 F (36.8 C) (Oral)   Resp 16   SpO2 97%      Physical Exam   Physical Exam  Vitals and nursing note reviewed.   Constitutional:       General: He is not in acute distress.     Appearance: Normal appearance. He is well-developed and well-groomed. He is not ill-appearing, toxic-appearing or diaphoretic.   Skin:     General: Skin is warm and dry.      Capillary Refill: Capillary refill takes less than 2 seconds.   Neurological:      Mental Status: He is alert and oriented to person, place, and time.   Psychiatric:         Behavior: Behavior normal. Behavior is cooperative.           Assessment/Plan   1. Primary hypertension  - lisinopril-hydroCHLOROthiazide (ZESTORETIC) 20-25 MG per tablet; Take 1 tablet by mouth daily  Dispense: 90 tablet; Refill: 1    HTN:    BP Readings from Last 3 Encounters:   07/09/23 145/90   03/07/23 121/79   10/23/22 132/88     Potassium    Date Value Ref Range Status   10/23/2022 4.7 3.5 - 5.3 mEq/L Final   11/04/2021 4.7 3.5 - 5.3 mEq/L Final   02/03/2020 4.2 3.5 - 5.1 mEq/L Final     Renal Function Trend:  Lab Results   Component Value Date    CREAT 0.9 10/23/2022    CREAT 0.8 11/04/2021    CREAT 0.9 02/03/2020       Lab Results   Component Value Date    EGFR >60.0 10/23/2022    EGFR >60.0 11/04/2021    EGFR >60.0 02/03/2020       2. Other hyperlipidemia  - rosuvastatin (CRESTOR) 10 MG tablet; Take 1 tablet (10 mg) by mouth daily  Dispense: 90 tablet; Refill: 1    Hyperlipidemia:  Lab Results   Component Value Date    CHOL 183 10/23/2022    CHOL 159 11/04/2021    TRIG 83 10/23/2022    TRIG 100 11/04/2021    HDL 47 10/23/2022    HDL 45 11/04/2021    LDL 119 (H) 10/23/2022    LDL 94 11/04/2021         3.  Immunization due  - Flu vaccine, TRIVALENT, 6 months and older (FLUARIX/FLULAVAL/FLUZONE), single-dose PF, 0.5 mL    Pt asking to be vaccinated with influenza shot today. Pt given counseling about injection. No further questions asked. Vaccine placed during office visit. Pt tolerated injection well.

## 2023-08-07 ENCOUNTER — Encounter (INDEPENDENT_AMBULATORY_CARE_PROVIDER_SITE_OTHER): Payer: Self-pay | Admitting: Family Medicine

## 2023-08-07 ENCOUNTER — Ambulatory Visit (FREE_STANDING_LABORATORY_FACILITY): Payer: No Typology Code available for payment source | Admitting: Family Medicine

## 2023-08-07 VITALS — BP 120/83 | HR 81 | Temp 98.8°F | Ht 69.69 in | Wt 397.2 lb

## 2023-08-07 DIAGNOSIS — E119 Type 2 diabetes mellitus without complications: Secondary | ICD-10-CM

## 2023-08-07 DIAGNOSIS — E7849 Other hyperlipidemia: Secondary | ICD-10-CM

## 2023-08-07 DIAGNOSIS — M1711 Unilateral primary osteoarthritis, right knee: Secondary | ICD-10-CM

## 2023-08-07 DIAGNOSIS — H6991 Unspecified Eustachian tube disorder, right ear: Secondary | ICD-10-CM

## 2023-08-07 DIAGNOSIS — Z23 Encounter for immunization: Secondary | ICD-10-CM

## 2023-08-07 DIAGNOSIS — Z6841 Body Mass Index (BMI) 40.0 and over, adult: Secondary | ICD-10-CM

## 2023-08-07 DIAGNOSIS — Z0000 Encounter for general adult medical examination without abnormal findings: Secondary | ICD-10-CM

## 2023-08-07 DIAGNOSIS — J4540 Moderate persistent asthma, uncomplicated: Secondary | ICD-10-CM

## 2023-08-07 DIAGNOSIS — I10 Essential (primary) hypertension: Secondary | ICD-10-CM

## 2023-08-07 DIAGNOSIS — E66813 Obesity, class 3: Secondary | ICD-10-CM

## 2023-08-07 LAB — COMPREHENSIVE METABOLIC PANEL
ALT: 40 U/L (ref 0–55)
AST (SGOT): 25 U/L (ref 5–41)
Albumin/Globulin Ratio: 1.3 (ref 0.9–2.2)
Albumin: 4.1 g/dL (ref 3.5–5.0)
Alkaline Phosphatase: 49 U/L (ref 37–117)
Anion Gap: 9 (ref 5.0–15.0)
BUN: 14 mg/dL (ref 9–28)
Bilirubin, Total: 0.5 mg/dL (ref 0.2–1.2)
CO2: 26 meq/L (ref 17–29)
Calcium: 9.9 mg/dL (ref 8.5–10.5)
Chloride: 101 meq/L (ref 99–111)
Creatinine: 1 mg/dL (ref 0.5–1.5)
GFR: >60 mL/min/{1.73_m2} (ref >=60.0)
Globulin: 3.2 g/dL (ref 2.0–3.6)
Glucose: 127 mg/dL — ABNORMAL HIGH (ref 70–100)
Hemolysis Index: 14 {index}
Potassium: 4.3 meq/L (ref 3.5–5.3)
Protein, Total: 7.3 g/dL (ref 6.0–8.3)
Sodium: 136 meq/L (ref 135–145)

## 2023-08-07 LAB — LIPID PANEL
Cholesterol / HDL Ratio: 4 {index}
Cholesterol: 169 mg/dL (ref <=199)
HDL: 42 mg/dL (ref >=40)
LDL Calculated: 108 mg/dL — ABNORMAL HIGH (ref 0–99)
Triglycerides: 95 mg/dL (ref 34–149)
VLDL Calculated: 19 mg/dL (ref 10–40)

## 2023-08-07 LAB — HEMOGLOBIN A1C
Average Estimated Glucose: 191.5 mg/dL
Hemoglobin A1C: 8.3 % — ABNORMAL HIGH (ref 4.6–5.6)

## 2023-08-07 MED ORDER — FLUTICASONE PROPIONATE 50 MCG/ACT NA SUSP
2.0000 | Freq: Every day | NASAL | 0 refills | Status: DC
Start: 2023-08-07 — End: 2023-08-28

## 2023-08-07 NOTE — Progress Notes (Signed)
Date: 08/07/2023 1:12 PM   Patient ID: Andrew Casey is a 52 y.o. male.         Have you seen any specialists since your last visit with [place]?  Yes  Saw pulmonologist about a month ago, got rx for advair discus     The patient was informed that the following HM items are still outstanding:   Health Maintenance Due   Topic Date Due    OPHTHALMOLOGY EXAM  Never done    COVID-19 Vaccine (4 - 2023-24 season) 06/17/2023       Subjective:      Chief Complaint:  Chief Complaint   Patient presents with    Annual Exam     Last meal was around 10:00am     Patient presents for annual physical exam and follow-up of chronic conditions which include hypertension, hyperlipidemia, type 2 diabetes mellitus, asthma and obesity.  Patient is mentioning muffled sensation in his right ear.  Symptoms started 1 to 2 weeks ago following an illness.  Denies actual pain or discharge.  No vertigo.  Also mentions recent flareup of his chronic right knee pain.  Patient does monitor his blood pressures at home and reports readings in the 120/80 range.  He is compliant with his medicines and unaware of side effects.  Also routinely takes rosuvastatin and denies myalgias or nausea.  Does not monitor blood sugars at home.  Denies polyuria or polydipsia.  Admits to a poor diet with minimal physical activity.  He has gained 7 pounds since his last visit with me in January.  Asthma symptoms are stable with use of his inhalers and he follows routinely with pulmonology.    HPI:  Visit Type: Health Maintenance Visit  Work Status: working full-time  Reported Health: fair health  Reported Diet: moderate compliance with well-balanced diet  Reported Exercise: none  Dental: dentist visit > 1 year ago  Vision: eye exam > 1 year ago  Hearing: normal hearing, although R ear has been clogged up for about a week and a half after recovering from a cold  Immunization Status: Tdap vaccination due, Shingles vaccination due, and COVID booster due  Reproductive  Health: sexually active  Prior Screening Tests: last PSA < 1 year ago and last Cologuard 2023  General Health Risks: family history of prostate cancer, no family history of colon cancer, and no family history of breast cancer  Safety Elements Used: uses seat belts, smoke detectors in household, carbon monoxide detectors in household, and does not text and drive    Problem List:  Problem List[1]    Current Medications:  Medications Taking[2]    Allergies:  Allergies[3]    Past Medical History:  Medical History[4]    Past Surgical History:  Past Surgical History[5]    Family History:  Family History[6]    Social History:  Social History[7]     The following sections were reviewed this encounter by the provider:   Tobacco  Allergies  Meds  Problems  Med Hx  Surg Hx  Fam Hx         ROS:     Review of Systems   Constitutional:  Negative for chills, fatigue and unexpected weight change.   HENT:  Negative for congestion, ear pain, hearing loss and sore throat.    Eyes:  Negative for redness and visual disturbance.   Respiratory:  Negative for cough, shortness of breath and wheezing.    Cardiovascular:  Negative for chest pain and palpitations.  Gastrointestinal:  Negative for abdominal pain, blood in stool, constipation, diarrhea, nausea and vomiting.   Endocrine: Negative for cold intolerance and polydipsia.   Genitourinary:  Negative for difficulty urinating, dysuria, frequency and urgency.   Musculoskeletal:  Negative for arthralgias, joint swelling and myalgias.   Skin:  Negative for rash.   Neurological:  Negative for dizziness, weakness and headaches.   Hematological:  Negative for adenopathy. Does not bruise/bleed easily.   Psychiatric/Behavioral:  Negative for dysphoric mood and sleep disturbance. The patient is not nervous/anxious.         Objective:     Vitals:  BP 120/83 (BP Site: Left arm, Patient Position: Sitting, Cuff Size: X-Large)   Pulse 81   Temp 98.8 F (37.1 C) (Oral)   Ht 1.77 m (5' 9.69")    Wt (!) 180.2 kg (397 lb 3.2 oz)   SpO2 96%   BMI 57.51 kg/m       Physical Exam  Constitutional:       General: He is not in acute distress.     Appearance: Normal appearance.   HENT:      Right Ear: Ear canal normal. Tympanic membrane is retracted.      Left Ear: Tympanic membrane and ear canal normal.      Nose: Nose normal. No rhinorrhea.      Mouth/Throat:      Mouth: Mucous membranes are moist.      Pharynx: Oropharynx is clear. No posterior oropharyngeal erythema.   Eyes:      Extraocular Movements: Extraocular movements intact.      Conjunctiva/sclera: Conjunctivae normal.      Pupils: Pupils are equal, round, and reactive to light.   Neck:      Thyroid: No thyromegaly.   Cardiovascular:      Rate and Rhythm: Normal rate and regular rhythm.      Pulses: Normal pulses.      Heart sounds: No murmur heard.  Pulmonary:      Effort: Pulmonary effort is normal. No respiratory distress.      Breath sounds: Normal breath sounds. No wheezing.   Abdominal:      General: Abdomen is flat. Bowel sounds are normal. There is no distension.      Palpations: Abdomen is soft. There is no mass.      Tenderness: There is no abdominal tenderness.   Musculoskeletal:         General: No swelling. Normal range of motion.      Cervical back: Normal range of motion and neck supple. No rigidity or tenderness.      Right knee: Tenderness present over the medial joint line.      Instability Tests: Anterior drawer test negative. Anterior Lachman test negative. Medial McMurray test negative and lateral McMurray test negative.   Lymphadenopathy:      Cervical: No cervical adenopathy.   Skin:     General: Skin is warm and dry.      Findings: No rash.   Neurological:      General: No focal deficit present.      Mental Status: He is alert and oriented to person, place, and time.      Cranial Nerves: No cranial nerve deficit.      Deep Tendon Reflexes: Reflexes normal.   Psychiatric:         Mood and Affect: Mood normal.         Behavior:  Behavior normal.        Assessment:  1. Annual physical exam    2. Primary hypertension  - Comprehensive Metabolic Panel; Future  - Comprehensive Metabolic Panel    3. Other hyperlipidemia  - Lipid Panel; Future  - Lipid Panel    4. Moderate persistent asthma, unspecified whether complicated    5. Type 2 diabetes mellitus without complication, without long-term current use of insulin  - Hemoglobin A1C; Future  - Hemoglobin A1C  - metFORMIN (GLUCOPHAGE-XR) 500 MG 24 hr tablet; Take 1 tablet (500 mg) by mouth 2 (two) times daily  Dispense: 60 tablet; Refill: 3    6. Class 3 severe obesity due to excess calories with serious comorbidity and body mass index (BMI) of 50.0 to 59.9 in adult    7. Dysfunction of right eustachian tube  - fluticasone (FLONASE) 50 MCG/ACT nasal spray; 2 sprays by Nasal route daily  Dispense: 16 g; Refill: 0    8. Primary osteoarthritis of right knee    9. Need for vaccination  - Tdap vaccine greater than or equal to 7yo IM  - Zoster Vaccine Recomb,Adjuvanted (IM)      Plan:     Patient's blood pressure is well-controlled on his current dose of lisinopril-hydrochlorothiazide.  Will remain on this dose.  Laboratory performed today including comprehensive metabolic panel, lipid panel and hemoglobin A1c.  Encouraged dietary improvements and increase physical activity.  Continue to follow with his pulmonologist regarding his asthma.  He is offered fluticasone nasal spray for his eustachian tube dysfunction.  His Tdap vaccine is updated today and he will begin the Shingrix vaccine series.    Health Maintenance:   Recommend optimizing low carbohydrate diet efforts and obtaining at least 150 minutes of aerobic exercise per week. Recommend obtaining a Shingles vaccination. 2023 lab results reviewed with pt.Colorectal cancer screening is UTD - Cologuard Prostate cancer screening is UTD. Vision screening is due. Dental Screening is due.    Follow-up:   Return in about 6 months (around 02/05/2024) for  Hypertension, Diabetes.     Marquette Saa, DO           [1]   Patient Active Problem List  Diagnosis    Moderate persistent asthma, unspecified whether complicated    Type 2 diabetes mellitus without complication, without long-term current use of insulin    Other hyperlipidemia    Primary hypertension    Morbid obesity    Body mass index 50.0-59.9, adult    Cervical disc herniation    Chronic pain of right knee    OSA (obstructive sleep apnea)    Nocturnal hypoxemia   [2]   Outpatient Medications Marked as Taking for the 08/07/23 encounter (Office Visit) with Marquette Saa, DO   Medication Sig Dispense Refill    albuterol (PROVENTIL) (2.5 MG/3ML) 0.083% nebulizer solution Take 3 mLs (2.5 mg) by nebulization every 6 (six) hours as needed for Wheezing 75 mL 2    albuterol sulfate HFA (PROVENTIL) 108 (90 Base) MCG/ACT inhaler Inhale 2 puffs into the lungs every 4 (four) hours as needed for Wheezing or Shortness of Breath Dispense with spacer 1 each 0    Blood Glucose Monitoring Suppl (onetouch verio flex system) w/Device kit       fluticasone-salmeterol (ADVAIR DISKUS) 500-50 MCG/ACT Aerosol Pwdr, Breath Activated Inhale 1 puff into the lungs every 12 (twelve) hours 1 each 5    lisinopril-hydroCHLOROthiazide (ZESTORETIC) 20-25 MG per tablet Take 1 tablet by mouth daily 90 tablet 1    Nebulizer Misc Dispense one nebulizer machine  to include mask and tubing    Sig:  Use for breathing trouble, wheezing Q 4-6 hrs. 1 each 0    rosuvastatin (CRESTOR) 10 MG tablet Take 1 tablet (10 mg) by mouth daily 90 tablet 1    tadalafil (CIALIS) 20 MG tablet Take 1 tablet (20 mg) by mouth daily as needed for Erectile Dysfunction 10 tablet 0   [3]   Allergies  Allergen Reactions    Motrin [Ibuprofen]      Makes asthma worse, tolerates asa    [4]   Past Medical History:  Diagnosis Date    Asthma     Diabetes mellitus     Hypertension    [5]   Past Surgical History:  Procedure Laterality Date    APPENDECTOMY (OPEN)      SINUS SURGERY      [6]   Family History  Problem Relation Name Age of Onset    Diabetes Mother      Arthritis Mother      Prostate cancer Father      Multiple sclerosis Brother     [7]   Social History  Tobacco Use    Smoking status: Former     Types: Cigarettes    Smokeless tobacco: Never   Vaping Use    Vaping status: Never Used   Substance Use Topics    Alcohol use: Yes     Alcohol/week: 10.0 standard drinks of alcohol     Types: 10 Shots of liquor per week     Comment: social    Drug use: Never

## 2023-08-08 MED ORDER — METFORMIN HCL ER 500 MG PO TB24
500.0000 mg | ORAL_TABLET | Freq: Two times a day (BID) | ORAL | 3 refills | Status: AC
Start: 2023-08-08 — End: ?

## 2023-08-28 ENCOUNTER — Other Ambulatory Visit (INDEPENDENT_AMBULATORY_CARE_PROVIDER_SITE_OTHER): Payer: Self-pay | Admitting: Family Medicine

## 2023-08-28 DIAGNOSIS — H6991 Unspecified Eustachian tube disorder, right ear: Secondary | ICD-10-CM

## 2023-11-05 ENCOUNTER — Other Ambulatory Visit (INDEPENDENT_AMBULATORY_CARE_PROVIDER_SITE_OTHER): Payer: Self-pay | Admitting: Family Medicine

## 2023-11-05 ENCOUNTER — Other Ambulatory Visit (INDEPENDENT_AMBULATORY_CARE_PROVIDER_SITE_OTHER): Payer: Self-pay | Admitting: Nurse Practitioner

## 2023-11-05 DIAGNOSIS — N529 Male erectile dysfunction, unspecified: Secondary | ICD-10-CM

## 2023-11-05 DIAGNOSIS — I10 Essential (primary) hypertension: Secondary | ICD-10-CM

## 2023-11-05 DIAGNOSIS — E7849 Other hyperlipidemia: Secondary | ICD-10-CM

## 2023-11-07 MED ORDER — TADALAFIL 20 MG PO TABS
20.0000 mg | ORAL_TABLET | Freq: Every day | ORAL | 5 refills | Status: AC | PRN
Start: 2023-11-07 — End: ?

## 2023-11-07 NOTE — Telephone Encounter (Signed)
 RN called patient at [phone] to assist in scheduling an appointment, RN left a voicemail to call back.

## 2024-02-02 ENCOUNTER — Other Ambulatory Visit (INDEPENDENT_AMBULATORY_CARE_PROVIDER_SITE_OTHER): Payer: Self-pay | Admitting: Nurse Practitioner

## 2024-02-02 DIAGNOSIS — E7849 Other hyperlipidemia: Secondary | ICD-10-CM

## 2024-02-04 NOTE — Telephone Encounter (Signed)
 Medication(s) requested: rosuvastatin  100mg   Medication(s) last refilled: Jul 09, 2023, Qty: 90, Refills: 1  Last Visit: Oct 22,2024  Patient:  Does not have an upcoming appointment   Last Labs:  Lab Results   Component Value Date    WBC 6.95 11/04/2021    HGB 15.0 11/04/2021    HCT 47.3 11/04/2021    PLT 258 11/04/2021    CHOL 169 08/07/2023    TRIG 95 08/07/2023    HDL 42 08/07/2023    LDL 108 (H) 08/07/2023    ALT 40 08/07/2023    AST 25 08/07/2023    NA 136 08/07/2023    K 4.3 08/07/2023    CL 101 08/07/2023    CREAT 1.0 08/07/2023    BUN 14 08/07/2023    CO2 26 08/07/2023    TSH 0.94 11/04/2021    PSA 0.801 11/04/2021    GLU 127 (H) 08/07/2023    HGBA1C 8.3 (H) 08/07/2023

## 2024-02-27 ENCOUNTER — Other Ambulatory Visit (INDEPENDENT_AMBULATORY_CARE_PROVIDER_SITE_OTHER): Payer: Self-pay | Admitting: Family Medicine

## 2024-02-27 DIAGNOSIS — N529 Male erectile dysfunction, unspecified: Secondary | ICD-10-CM

## 2024-02-27 DIAGNOSIS — J4540 Moderate persistent asthma, uncomplicated: Secondary | ICD-10-CM

## 2024-02-28 MED ORDER — TADALAFIL 20 MG PO TABS
20.0000 mg | ORAL_TABLET | Freq: Every day | ORAL | 5 refills | Status: AC | PRN
Start: 2024-02-28 — End: ?

## 2024-02-28 MED ORDER — FLUTICASONE-SALMETEROL 500-50 MCG/ACT IN AEPB
1.0000 | INHALATION_SPRAY | Freq: Two times a day (BID) | RESPIRATORY_TRACT | 1 refills | Status: AC
Start: 2024-02-28 — End: ?

## 2024-03-26 ENCOUNTER — Other Ambulatory Visit (INDEPENDENT_AMBULATORY_CARE_PROVIDER_SITE_OTHER): Payer: Self-pay | Admitting: Family Medicine

## 2024-03-26 DIAGNOSIS — J4540 Moderate persistent asthma, uncomplicated: Secondary | ICD-10-CM

## 2024-04-01 ENCOUNTER — Telehealth (INDEPENDENT_AMBULATORY_CARE_PROVIDER_SITE_OTHER): Payer: Self-pay

## 2024-04-01 NOTE — Telephone Encounter (Signed)
 Pharmacy faxed refill request.

## 2024-04-03 ENCOUNTER — Other Ambulatory Visit (INDEPENDENT_AMBULATORY_CARE_PROVIDER_SITE_OTHER): Payer: Self-pay | Admitting: Internal Medicine

## 2024-04-03 DIAGNOSIS — J4540 Moderate persistent asthma, uncomplicated: Secondary | ICD-10-CM

## 2024-04-03 MED ORDER — ALBUTEROL SULFATE HFA 108 (90 BASE) MCG/ACT IN AERS
2.0000 | INHALATION_SPRAY | RESPIRATORY_TRACT | 0 refills | Status: AC | PRN
Start: 2024-04-03 — End: 2024-07-03

## 2024-04-03 NOTE — Telephone Encounter (Signed)
 Pt called in requesting refill for his Inhaler. Pt has upcoming appt on 08/14.    Please advise    [id number] - [place], [place] - [address]       Call back number     [phone]

## 2024-04-03 NOTE — Telephone Encounter (Signed)
 Per visit notes in 07/2023, Asthma symptoms are stable with use of his inhalers and he follows routinely with pulmonology.  However, I called pt and he states that he hasn't seen his pulmonologist in 2 years. He understands Dr. Bennet is no longer in the practice, open to pulm referral and requesting temp refill of albuterol  inhaler.

## 2024-04-03 NOTE — Telephone Encounter (Signed)
 Temp refill for the inhaler has been sent. Patient to discuss referral to pulmonology during upcoming appointment on 05/29/2024.

## 2024-05-29 ENCOUNTER — Ambulatory Visit (INDEPENDENT_AMBULATORY_CARE_PROVIDER_SITE_OTHER): Payer: Self-pay | Admitting: Internal Medicine

## 2024-07-01 ENCOUNTER — Telehealth (INDEPENDENT_AMBULATORY_CARE_PROVIDER_SITE_OTHER): Payer: Self-pay | Admitting: Cardiovascular Disease

## 2024-07-01 NOTE — Telephone Encounter (Signed)
 LVMTCB, please collect insurance info for upcoming cardiology appt.

## 2024-07-02 ENCOUNTER — Telehealth (INDEPENDENT_AMBULATORY_CARE_PROVIDER_SITE_OTHER): Payer: Self-pay | Admitting: Cardiovascular Disease

## 2024-07-02 NOTE — Telephone Encounter (Signed)
 Spoke to patient, he said he will give us  a call back to give us  his insurance information.

## 2024-07-03 ENCOUNTER — Ambulatory Visit (INDEPENDENT_AMBULATORY_CARE_PROVIDER_SITE_OTHER): Payer: Self-pay | Admitting: Cardiovascular Disease

## 2024-07-03 ENCOUNTER — Encounter (INDEPENDENT_AMBULATORY_CARE_PROVIDER_SITE_OTHER): Payer: Self-pay | Admitting: Cardiovascular Disease

## 2024-07-03 VITALS — BP 137/86 | HR 83 | Ht 70.0 in | Wt 385.0 lb

## 2024-07-03 DIAGNOSIS — I10 Essential (primary) hypertension: Secondary | ICD-10-CM

## 2024-07-03 DIAGNOSIS — E785 Hyperlipidemia, unspecified: Secondary | ICD-10-CM

## 2024-07-03 DIAGNOSIS — R0602 Shortness of breath: Secondary | ICD-10-CM

## 2024-07-03 NOTE — Progress Notes (Signed)
 [facility]SULTATION    CHIEF COMPLAINT: Shortness of breath     REFERRING PROVIDER: Hildegard Gall, MD    HPI:  I had the pleasure of seeing [name] today for cardiovascular evaluation. He is a pleasant 53 y.o. male with a history of type II DM, HTN, asthma, OSA presenting with shortness of breath.     He has had constant congestion, cough and wheezing for the past month. Has not seen a PCP regarding this. Denies fevers. Denies chest pain.     REVIEW OF SYSTEMS: All other systems reviewed and negative except as above.     PAST MEDICAL HISTORY: He has a past medical history of Asthma, Diabetes mellitus (CMS/HCC), and Hypertension. He has a past surgical history that includes APPENDECTOMY (OPEN) and Sinus surgery.    MEDICATIONS: Current Medications[1]     PHYSICAL EXAMINATION  Health Related Quality of Life:     Vital Signs: BP 137/86 (BP Site: Right arm, Patient Position: Sitting, Cuff Size: Large)   Pulse 83   Ht 1.778 m (5' 10)   Wt (!) 174.6 kg (385 lb)   SpO2 91% Comment: peaked at 93  BMI 55.24 kg/m    Chest: Clear to auscultation bilaterally  Cardiovascular: No murmurs or gallops.   Abdomen: Soft, nontender. No pulsatile masses or bruits.    Extremities: Warm without edema.     ECG:   07/03/2024 - sinus rhythm      LABS:   Lab Results   Component Value Date    WBC 6.95 11/04/2021    HGB 15.0 11/04/2021    HCT 47.3 11/04/2021    PLT 258 11/04/2021    NA 136 08/07/2023    K 4.3 08/07/2023    BUN 14 08/07/2023    CREAT 1.0 08/07/2023    GLU 127 (H) 08/07/2023    CHOL 169 08/07/2023    TRIG 95 08/07/2023    HDL 42 08/07/2023    LDL 108 (H) 08/07/2023    AST 25 08/07/2023    ALT 40 08/07/2023    HGBA1C 8.3 (H) 08/07/2023    TSH 0.94 11/04/2021        IMPRESSION/RECOMMENDATIONS:   Shortness of breath - with mild hypoxia on exam with spO2 between 91-93%. Associated with congestion, wheezing. Suspect respiratory cause, but cannot rule out cardiac etiology. No associated edema, fluid  retention.   - Will obtain NT-proBNP  - Will order CXR   - Schedule TTE to assess heart structure and function   - Follow up with PCP     2. HTN - BP slightly above goal of < 130/80   - Continue current regimen for now     3. HLD   - Continue moderate dose statin   - Repeat annual labs with PCP     Thank you for involving me in Andrew Casey's care. A copy of this note will be sent to the referring provider.     Andrew Andrew Paterson, MD    [facility] [place]  [address]  [address]  [place], [place] [address]  Phone - [phone]  Fax - [phone]          [1]   Current Outpatient Medications:     albuterol  (PROVENTIL ) (2.5 MG/3ML) 0.083% nebulizer solution, Take 3 mLs (2.5 mg) by nebulization every 6 (six) hours as needed for Wheezing, Disp: 75 mL, Rfl: 2    albuterol  sulfate HFA (PROVENTIL ) 108 (90 Base) MCG/ACT inhaler, Inhale 2  puffs into the lungs every 4 (four) hours as needed for Wheezing or Shortness of Breath Dispense with spacer, Disp: 1 each, Rfl: 0    Blood Glucose Monitoring Suppl (onetouch verio flex system) w/Device kit, , Disp: , Rfl:     fluticasone -salmeterol (ADVAIR  DISKUS) 500-50 MCG/ACT Aerosol Pwdr, Breath Activated, Inhale 1 puff into the lungs every 12 (twelve) hours, Disp: 1 each, Rfl: 1    lisinopril -hydroCHLOROthiazide  (ZESTORETIC ) 20-25 MG per tablet, Take 1 tablet by mouth daily, Disp: 90 tablet, Rfl: 1    Nebulizer Misc, Dispense one nebulizer machine to include mask and tubing  Sig:  Use for breathing trouble, wheezing Q 4-6 hrs., Disp: 1 each, Rfl: 0    rosuvastatin  (CRESTOR ) 10 MG tablet, TAKE 1 TABLET BY MOUTH EVERY DAY, Disp: 90 tablet, Rfl: 1    tadalafil  (CIALIS ) 20 MG tablet, Take 1 tablet (20 mg) by mouth once daily as needed for Erectile Dysfunction, Disp: 10 tablet, Rfl: 5    fluticasone  (FLONASE ) 50 MCG/ACT nasal spray, SPRAY 2 SPRAYS IN THE AFFECTED NOSTRIL EVERY DAY (Patient not taking: Reported on 07/03/2024), Disp: 16 mL, Rfl: 2    lisinopril  (ZESTRIL ) 10 MG tablet,  Take 1 tablet (10 mg) by mouth once daily (Patient not taking: Reported on 07/03/2024), Disp: , Rfl:     metFORMIN  (GLUCOPHAGE -XR) 500 MG 24 hr tablet, Take 1 tablet (500 mg) by mouth 2 (two) times daily (Patient not taking: Reported on 07/03/2024), Disp: 60 tablet, Rfl: 3

## 2024-07-08 LAB — ECG 12-LEAD
Atrial Rate: 86 {beats}/min
IHS MUSE NARRATIVE AND IMPRESSION: NORMAL
P Axis: 77 degrees
P-R Interval: 188 ms
Q-T Interval: 364 ms
QRS Duration: 90 ms
QTC Calculation (Bezet): 435 ms
R Axis: 16 degrees
T Axis: 64 degrees
Ventricular Rate: 86 {beats}/min

## 2024-07-24 ENCOUNTER — Encounter (INDEPENDENT_AMBULATORY_CARE_PROVIDER_SITE_OTHER)

## 2024-09-08 ENCOUNTER — Other Ambulatory Visit (INDEPENDENT_AMBULATORY_CARE_PROVIDER_SITE_OTHER): Payer: Self-pay | Admitting: Nurse Practitioner

## 2024-09-08 DIAGNOSIS — I10 Essential (primary) hypertension: Secondary | ICD-10-CM

## 2024-09-09 NOTE — Telephone Encounter (Signed)
Lvm to schedule appt regarding med refill
